# Patient Record
Sex: Female | Born: 1960 | Race: Black or African American | Hispanic: No | Marital: Single | Smoking: Former smoker
Health system: Southern US, Community
[De-identification: ages and names within clinical notes are randomized; demographics above are authoritative.]

## PROBLEM LIST (undated history)

## (undated) DIAGNOSIS — F32A Depression, unspecified: Secondary | ICD-10-CM

## (undated) DIAGNOSIS — F419 Anxiety disorder, unspecified: Secondary | ICD-10-CM

## (undated) DIAGNOSIS — D219 Benign neoplasm of connective and other soft tissue, unspecified: Secondary | ICD-10-CM

## (undated) DIAGNOSIS — F329 Major depressive disorder, single episode, unspecified: Secondary | ICD-10-CM

## (undated) DIAGNOSIS — Z789 Other specified health status: Secondary | ICD-10-CM

## (undated) HISTORY — DX: Anxiety disorder, unspecified: F41.9

## (undated) HISTORY — DX: Depression, unspecified: F32.A

## (undated) HISTORY — PX: NO PAST SURGERIES: SHX2092

---
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
## 1999-03-22 ENCOUNTER — Other Ambulatory Visit: Admission: RE | Admit: 1999-03-22 | Discharge: 1999-03-22 | Payer: Self-pay | Admitting: Family Medicine

## 1999-03-23 ENCOUNTER — Encounter: Admission: RE | Admit: 1999-03-23 | Discharge: 1999-03-23 | Payer: Self-pay | Admitting: Family Medicine

## 2001-07-15 ENCOUNTER — Encounter (INDEPENDENT_AMBULATORY_CARE_PROVIDER_SITE_OTHER): Payer: Self-pay | Admitting: *Deleted

## 2001-07-15 LAB — CONVERTED CEMR LAB

## 2001-07-23 ENCOUNTER — Encounter: Admission: RE | Admit: 2001-07-23 | Discharge: 2001-07-23 | Payer: Self-pay | Admitting: Family Medicine

## 2002-06-23 ENCOUNTER — Encounter: Admission: RE | Admit: 2002-06-23 | Discharge: 2002-06-23 | Payer: Self-pay | Admitting: Family Medicine

## 2004-11-08 ENCOUNTER — Emergency Department (HOSPITAL_COMMUNITY): Admission: EM | Admit: 2004-11-08 | Discharge: 2004-11-08 | Payer: Self-pay | Admitting: Family Medicine

## 2006-06-13 DIAGNOSIS — E669 Obesity, unspecified: Secondary | ICD-10-CM | POA: Insufficient documentation

## 2006-06-14 ENCOUNTER — Encounter (INDEPENDENT_AMBULATORY_CARE_PROVIDER_SITE_OTHER): Payer: Self-pay | Admitting: *Deleted

## 2012-09-21 ENCOUNTER — Encounter (HOSPITAL_COMMUNITY): Payer: Self-pay

## 2012-09-21 ENCOUNTER — Emergency Department (HOSPITAL_COMMUNITY): Payer: Medicaid Other

## 2012-09-21 ENCOUNTER — Observation Stay (HOSPITAL_COMMUNITY)
Admission: EM | Admit: 2012-09-21 | Discharge: 2012-09-22 | Disposition: A | Discharge status: Home / Self Care | Payer: Medicaid Other | Attending: Obstetrics & Gynecology | Admitting: Obstetrics & Gynecology

## 2012-09-21 DIAGNOSIS — D500 Iron deficiency anemia secondary to blood loss (chronic): Secondary | ICD-10-CM | POA: Insufficient documentation

## 2012-09-21 DIAGNOSIS — R0602 Shortness of breath: Secondary | ICD-10-CM | POA: Insufficient documentation

## 2012-09-21 DIAGNOSIS — N920 Excessive and frequent menstruation with regular cycle: Principal | ICD-10-CM | POA: Insufficient documentation

## 2012-09-21 DIAGNOSIS — R059 Cough, unspecified: Secondary | ICD-10-CM | POA: Insufficient documentation

## 2012-09-21 DIAGNOSIS — D649 Anemia, unspecified: Secondary | ICD-10-CM

## 2012-09-21 DIAGNOSIS — R079 Chest pain, unspecified: Secondary | ICD-10-CM | POA: Insufficient documentation

## 2012-09-21 DIAGNOSIS — R05 Cough: Secondary | ICD-10-CM | POA: Insufficient documentation

## 2012-09-21 DIAGNOSIS — N939 Abnormal uterine and vaginal bleeding, unspecified: Secondary | ICD-10-CM

## 2012-09-21 DIAGNOSIS — D259 Leiomyoma of uterus, unspecified: Secondary | ICD-10-CM | POA: Insufficient documentation

## 2012-09-21 LAB — CBC
MCV: 57.6 fL — ABNORMAL LOW (ref 78.0–100.0)
Platelets: 341 10*3/uL (ref 150–400)
RBC: 1.77 MIL/uL — ABNORMAL LOW (ref 3.87–5.11)
WBC: 6.1 10*3/uL (ref 4.0–10.5)

## 2012-09-21 LAB — COMPREHENSIVE METABOLIC PANEL
ALT: 8 U/L (ref 0–35)
AST: 13 U/L (ref 0–37)
Alkaline Phosphatase: 36 U/L — ABNORMAL LOW (ref 39–117)
CO2: 24 mEq/L (ref 19–32)
Chloride: 105 mEq/L (ref 96–112)
Creatinine, Ser: 0.92 mg/dL (ref 0.50–1.10)
GFR calc non Af Amer: 71 mL/min — ABNORMAL LOW (ref >90)
Total Bilirubin: 0.4 mg/dL (ref 0.3–1.2)

## 2012-09-21 LAB — POCT I-STAT, CHEM 8
Calcium, Ion: 1.14 mmol/L (ref 1.12–1.23)
Creatinine, Ser: 0.9 mg/dL (ref 0.50–1.10)
Hemoglobin: 5.4 g/dL — CL (ref 12.0–15.0)
Sodium: 141 mEq/L (ref 135–145)
TCO2: 22 mmol/L (ref 0–100)

## 2012-09-21 LAB — PREPARE RBC (CROSSMATCH)

## 2012-09-21 LAB — TROPONIN I: Troponin I: <0.3 ng/mL (ref <0.30)

## 2012-09-21 MED ORDER — ALBUTEROL SULFATE (5 MG/ML) 0.5% IN NEBU
5.0000 mg | INHALATION_SOLUTION | Freq: Once | RESPIRATORY_TRACT | Status: AC
  Administered 2012-09-21: 5 mg via RESPIRATORY_TRACT
  Filled 2012-09-21: qty 1

## 2012-09-21 MED ORDER — IPRATROPIUM BROMIDE 0.02 % IN SOLN
0.5000 mg | Freq: Once | RESPIRATORY_TRACT | Status: AC
  Administered 2012-09-21: 0.5 mg via RESPIRATORY_TRACT
  Filled 2012-09-21: qty 2.5

## 2012-09-21 NOTE — ED Provider Notes (Signed)
History     CSN: [id number]  Arrival date & time 09/21/12  1542   First MD Initiated Contact with Patient 09/21/12 1604      Chief Complaint  Patient presents with  . Shortness of Breath  . Chest Pain  . Cough     HPI Pt presents with NAD- Pt reports shortness of breath and chest pain left side that radiates to left chest and left arm and back. Increases with exertion and with lying down. Pt c/o of cough without fever or cold like symptoms.  Patient has had productive cough with clear sputum.  Patient denies wheezing.  Denies history of embolic disease or leg swelling or pain.  History reviewed. No pertinent past medical history.  History reviewed. No pertinent past surgical history.  No family history on file.  History  Substance Use Topics  . Smoking status: Never Smoker   . Smokeless tobacco: Not on file  . Alcohol Use: No    OB History   Grav Para Term Preterm Abortions TAB SAB Ect Mult Living                  Review of Systems  Genitourinary: Positive for vaginal bleeding (Very heavy periods for the last 2 years.  Goes through 1 package of pads every one to 2 days with periods lasting 7-10 days per minute).  All other systems reviewed and are negative.   All other systems reviewed and are negative Allergies  Review of patient's allergies indicates no known allergies.  Home Medications   Current Outpatient Rx  Name  Route  Sig  Dispense  Refill  . acetaminophen (TYLENOL) 500 MG tablet   Oral   Take 1,000 mg by mouth every 6 (six) hours as needed for pain.         . Aspirin-Salicylamide-Caffeine (BC HEADACHE POWDER PO)   Oral   Take 1 packet by mouth daily as needed (for pain).         Marland Kitchen ibuprofen (ADVIL,MOTRIN) 200 MG tablet   Oral   Take 600 mg by mouth every 8 (eight) hours as needed for pain.           BP 105/73  Pulse 103  Temp(Src) 98.8 F (37.1 C) (Oral)  Resp 18  SpO2 100%  LMP 09/19/2012  Physical Exam  Nursing note and vitals  reviewed. Constitutional: She is oriented to person, place, and time. She appears well-developed and well-nourished. No distress.  HENT:  Head: Normocephalic and atraumatic.  Mouth/Throat: Mucous membranes are pale.  Eyes: Pupils are equal, round, and reactive to light.  Conjunctiva pale  Neck: Normal range of motion.  Cardiovascular: Intact distal pulses.  Tachycardia present.   Pulmonary/Chest: No respiratory distress. She has no wheezes.  Abdominal: Normal appearance. She exhibits no distension.  Musculoskeletal: Normal range of motion.  Neurological: She is alert and oriented to person, place, and time. No cranial nerve deficit.  Skin: Skin is warm and dry. No rash noted.  Psychiatric: She has a normal mood and affect. Her behavior is normal.    ED Course  Procedures (including critical care time)  Date: 09/21/2012  Rate: 96  Rhythm: normal sinus rhythm  QRS Axis: normal  Intervals: normal  ST/T Wave abnormalities: normal  Conduction Disutrbances: none  Narrative Interpretation: unremarkable     Labs Reviewed  CBC - Abnormal; Notable for the following:    RBC 1.77 (*)    Hemoglobin 2.7 (*)    HCT 10.2 (*)  MCV 57.6 (*)    MCH 15.3 (*)    MCHC 26.5 (*)    RDW 23.9 (*)    All other components within normal limits  COMPREHENSIVE METABOLIC PANEL - Abnormal; Notable for the following:    Potassium 3.4 (*)    Glucose, Bld 111 (*)    Albumin 3.1 (*)    Alkaline Phosphatase 36 (*)    GFR calc non Af Amer 71 (*)    GFR calc Af Amer 83 (*)    All other components within normal limits  PRO B NATRIURETIC PEPTIDE - Abnormal; Notable for the following:    Pro B Natriuretic peptide (BNP) 308.2 (*)    All other components within normal limits  TROPONIN I  TYPE AND SCREEN  ABO/RH  PREPARE RBC (CROSSMATCH)   Dg Chest Portable 1 View  09/21/2012   *RADIOLOGY REPORT*  Clinical Data: Shortness of breath.  Chest pain.  Cough.  PORTABLE CHEST - 1 VIEW  Comparison: None.   Findings: Cardiomediastinal silhouette is within normal limits. The lungs are free of focal consolidations and pleural effusions. Bony structures have a normal appearance.  IMPRESSION: Negative exam.   Original Report Authenticated By: Norva Pavlov, M.D.     1. Anemia   2. Abnormal vaginal bleeding       MDM  After review of patient's labs and further review of her review of systems most likely source of her anemia is vaginal in origin.  Discussed case with GYN on call who okayed the transfer to [facility] hospital for definitive care and treatment.        Nelia Shi, MD 09/21/12 [phone]

## 2012-09-21 NOTE — ED Notes (Signed)
Pt presents with NAD- Pt reports shortness of breath and chest pain left side that radiates to left chest and left arm and back. Increases with exertion and with lying down.  Pt c/o of cough without fever or cold like symptoms.

## 2012-09-22 ENCOUNTER — Ambulatory Visit (HOSPITAL_COMMUNITY): Payer: Medicaid Other

## 2012-09-22 ENCOUNTER — Observation Stay (HOSPITAL_COMMUNITY): Payer: Self-pay

## 2012-09-22 DIAGNOSIS — D259 Leiomyoma of uterus, unspecified: Secondary | ICD-10-CM

## 2012-09-22 DIAGNOSIS — R0602 Shortness of breath: Secondary | ICD-10-CM

## 2012-09-22 DIAGNOSIS — N920 Excessive and frequent menstruation with regular cycle: Secondary | ICD-10-CM

## 2012-09-22 DIAGNOSIS — D500 Iron deficiency anemia secondary to blood loss (chronic): Secondary | ICD-10-CM

## 2012-09-22 LAB — CBC WITH DIFFERENTIAL/PLATELET
Basophils Absolute: 0.1 10*3/uL (ref 0.0–0.1)
Eosinophils Relative: 3 % (ref 0–5)
HCT: 20 % — ABNORMAL LOW (ref 36.0–46.0)
Hemoglobin: 6.5 g/dL — CL (ref 12.0–15.0)
Lymphocytes Relative: 28 % (ref 12–46)
Lymphs Abs: 1.4 10*3/uL (ref 0.7–4.0)
MCV: 69.9 fL — ABNORMAL LOW (ref 78.0–100.0)
Monocytes Absolute: 0.5 10*3/uL (ref 0.1–1.0)
Monocytes Relative: 10 % (ref 3–12)
Neutro Abs: 2.8 10*3/uL (ref 1.7–7.7)
RDW: 25.2 % — ABNORMAL HIGH (ref 11.5–15.5)
WBC: 4.9 10*3/uL (ref 4.0–10.5)

## 2012-09-22 LAB — CBC
MCH: 24.3 pg — ABNORMAL LOW (ref 26.0–34.0)
MCHC: 33.5 g/dL (ref 30.0–36.0)
MCV: 72.7 fL — ABNORMAL LOW (ref 78.0–100.0)
Platelets: 194 10*3/uL (ref 150–400)
RBC: 3.74 MIL/uL — ABNORMAL LOW (ref 3.87–5.11)

## 2012-09-22 LAB — ABO/RH: ABO/RH(D): AB POS

## 2012-09-22 MED ORDER — DEXTROSE-NACL 5-0.45 % IV SOLN: INTRAVENOUS | Status: DC

## 2012-09-22 MED ORDER — MEGESTROL ACETATE 40 MG PO TABS
40.0000 mg | ORAL_TABLET | Freq: Every day | ORAL | Status: DC
  Administered 2012-09-22: 40 mg via ORAL
  Filled 2012-09-22 (×2): qty 1

## 2012-09-22 MED ORDER — ALBUTEROL SULFATE (5 MG/ML) 0.5% IN NEBU
5.0000 mg | INHALATION_SOLUTION | RESPIRATORY_TRACT | Status: DC | PRN
  Administered 2012-09-22: 5 mg via RESPIRATORY_TRACT
  Filled 2012-09-22 (×3): qty 1

## 2012-09-22 MED ORDER — IPRATROPIUM BROMIDE 0.02 % IN SOLN
0.5000 mg | RESPIRATORY_TRACT | Status: DC | PRN
  Administered 2012-09-22: 0.5 mg via RESPIRATORY_TRACT
  Filled 2012-09-22: qty 2.5

## 2012-09-22 MED ORDER — ZOLPIDEM TARTRATE 5 MG PO TABS: 5.0000 mg | ORAL_TABLET | Freq: Every evening | ORAL | Status: DC | PRN

## 2012-09-22 MED ORDER — ALUM & MAG HYDROXIDE-SIMETH 200-200-20 MG/5ML PO SUSP: 30.0000 mL | ORAL | Status: DC | PRN

## 2012-09-22 MED ORDER — SIMETHICONE 80 MG PO CHEW: 80.0000 mg | CHEWABLE_TABLET | Freq: Four times a day (QID) | ORAL | Status: DC | PRN

## 2012-09-22 MED ORDER — DEXTROMETHORPHAN POLISTIREX 30 MG/5ML PO LQCR
30.0000 mg | Freq: Four times a day (QID) | ORAL | Status: DC | PRN
  Administered 2012-09-22 (×2): 30 mg via ORAL
  Filled 2012-09-22 (×2): qty 5

## 2012-09-22 MED ORDER — MEGESTROL ACETATE 20 MG PO TABS: 40.0000 mg | ORAL_TABLET | Freq: Every day | ORAL | Status: DC

## 2012-09-22 NOTE — Discharge Summary (Signed)
Physician Discharge Summary  Patient ID: KERIN KREN MRN: [id number] DOB/AGE: Jan 27, 1961 52 y.o.  Admit date: 09/21/2012 Discharge date: 09/22/2012  Admission Diagnoses: menorrhagia, fibroids, anemia  Discharge Diagnoses: same Active Problems:   * No active hospital problems. *   Discharged Condition: good  Hospital Course: She was admitted with a hemoglobin of 2.7. She was given a total of 6 units PRBC and 1 unit of FFP. Her bleeding slowed down significantly with 40 mg of Megace. Her u/s showed fibroids. She voiced her readiness to go home by the evening of 09/22/12. She was ambulating, eating, and voiding without difficulty.  Consults: None  Significant Diagnostic Studies: labs: as above  Treatments: transfusion as above  Discharge Exam: Blood pressure 126/78, pulse 66, temperature 98.4 F (36.9 C), temperature source Oral, resp. rate 20, height 5\' 6"  (1.676 m), weight 102.059 kg (225 lb), last menstrual period 09/19/2012, SpO2 100.00%. General appearance: alert Resp: clear to auscultation bilaterally Cardio: regular rate and rhythm, S1, S2 normal, no murmur, click, rub or gallop GI: soft, non-tender; bowel sounds normal; no masses,  no organomegaly  Disposition: Final discharge disposition not confirmed     Medication List    TAKE these medications       acetaminophen 500 MG tablet  Commonly known as:  TYLENOL  Take 1,000 mg by mouth every 6 (six) hours as needed for pain.     BC HEADACHE POWDER PO  Take 1 packet by mouth daily as needed (for pain).     ibuprofen 200 MG tablet  Commonly known as:  ADVIL,MOTRIN  Take 600 mg by mouth every 8 (eight) hours as needed for pain.     megestrol 20 MG tablet  Commonly known as:  MEGACE  Take 2 tablets (40 mg total) by mouth daily.           Follow-up Information   Follow up with [facility]. Schedule an appointment as soon as possible for a visit in 1 week. (endometrial biopsy)    Contact  information:   [address] [place] [address] [phone]      Signed: Allie Bossier. 09/22/2012, 5:08 PM

## 2012-09-22 NOTE — Progress Notes (Signed)
Patient ambulating to bathroom with steady gait.  Vaginal bleeding small amount.  Patient states she feels better.  No c/o chest pain or shortness of breath.

## 2012-09-22 NOTE — H&P (Signed)
Tamara Short is an 52 y.o. female. Pt is a [id number] who presented to [facility] ED with c/o chest pain with coughing and SOB.  She was found to have a Hgb of 2.  She reports that she has been bleeding heavy since end of 2013.  She reports occ hot flushes and mood changes.  She received 2 units of blood at [facility] prior to transfer and says that she feels a little better.  Her bleeding has decreased. She does reports a couple of months with no cycle at all.  Pertinent Gynecological History: Menses: irregular occurring approximately every 14 days with spotting approximately 20 days per month Bleeding: intermenstrual bleeding Contraception: none DES exposure: unknown Blood transfusions: none Sexually transmitted diseases: no past history Previous GYN Procedures: none  Last pap: many years ago Date: unk ?2009 OB History: G4, P4   Menstrual History:  Patient's last menstrual period was 09/19/2012.    History reviewed. No pertinent past medical history.  History reviewed. No pertinent past surgical history.  No family history on file.  Social History:  reports that she has never smoked. She does not have any smokeless tobacco history on file. She reports that she does not drink alcohol or use illicit drugs.  Allergies: No Known Allergies  Prescriptions prior to admission  Medication Sig Dispense Refill  . acetaminophen (TYLENOL) 500 MG tablet Take 1,000 mg by mouth every 6 (six) hours as needed for pain.      . Aspirin-Salicylamide-Caffeine (BC HEADACHE POWDER PO) Take 1 packet by mouth daily as needed (for pain).      Marland Kitchen ibuprofen (ADVIL,MOTRIN) 200 MG tablet Take 600 mg by mouth every 8 (eight) hours as needed for pain.        ROS  Blood pressure 120/64, pulse 72, temperature 98 F (36.7 C), temperature source Oral, resp. rate 18, height 5\' 6"  (1.676 m), weight 225 lb (102.059 kg), last menstrual period 09/19/2012, SpO2 100.00%. Physical Exam Lungs: CTA CV: RRR Abd: obese; NT, ND no  incision noted  Results for orders placed during the hospital encounter of 09/21/12 (from the past 24 hour(s))  TYPE AND SCREEN     Status: None   Collection Time    09/21/12  5:39 PM      Result Value Range   ABO/RH(D) AB POS     Antibody Screen NEG     Sample Expiration 09/24/2012     Unit Number [id number]     Blood Component Type RED CELLS,LR     Unit division 00     Status of Unit ISSUED,FINAL     Transfusion Status OK TO TRANSFUSE     Crossmatch Result Compatible     Unit Number [id number]     Blood Component Type RED CELLS,LR     Unit division 00     Status of Unit ISSUED,FINAL     Transfusion Status OK TO TRANSFUSE     Crossmatch Result Compatible     Unit Number [id number]     Blood Component Type RED CELLS,LR     Unit division 00     Status of Unit ISSUED,FINAL     Transfusion Status OK TO TRANSFUSE     Crossmatch Result Compatible     Unit Number [id number]     Blood Component Type RED CELLS,LR     Unit division 00     Status of Unit ALLOCATED     Transfusion Status OK TO TRANSFUSE     Crossmatch Result Compatible  CBC     Status: Abnormal   Collection Time    09/21/12  5:40 PM      Result Value Range   WBC 6.1  4.0 - 10.5 K/uL   RBC 1.77 (*) 3.87 - 5.11 MIL/uL   Hemoglobin 2.7 (*) 12.0 - 15.0 g/dL   HCT 16.1 (*) 09.6 - 04.5 %   MCV 57.6 (*) 78.0 - 100.0 fL   MCH 15.3 (*) 26.0 - 34.0 pg   MCHC 26.5 (*) 30.0 - 36.0 g/dL   RDW 40.9 (*) 81.1 - 91.4 %   Platelets 341  150 - 400 K/uL  COMPREHENSIVE METABOLIC PANEL     Status: Abnormal   Collection Time    09/21/12  5:40 PM      Result Value Range   Sodium 138  135 - 145 mEq/L   Potassium 3.4 (*) 3.5 - 5.1 mEq/L   Chloride 105  96 - 112 mEq/L   CO2 24  19 - 32 mEq/L   Glucose, Bld 111 (*) 70 - 99 mg/dL   BUN 8  6 - 23 mg/dL   Creatinine, Ser 7.82  0.50 - 1.10 mg/dL   Calcium 8.6  8.4 - 95.6 mg/dL   Total Protein 6.7  6.0 - 8.3 g/dL   Albumin 3.1 (*) 3.5 - 5.2 g/dL   AST 13  0 - 37 U/L    ALT 8  0 - 35 U/L   Alkaline Phosphatase 36 (*) 39 - 117 U/L   Total Bilirubin 0.4  0.3 - 1.2 mg/dL   GFR calc non Af Amer 71 (*) >90 mL/min   GFR calc Af Amer 83 (*) >90 mL/min  PRO B NATRIURETIC PEPTIDE     Status: Abnormal   Collection Time    09/21/12  5:40 PM      Result Value Range   Pro B Natriuretic peptide (BNP) 308.2 (*) 0 - 125 pg/mL  TROPONIN I     Status: None   Collection Time    09/21/12  5:40 PM      Result Value Range   Troponin I <0.30  <0.30 ng/mL  ABO/RH     Status: None   Collection Time    09/21/12  5:52 PM      Result Value Range   ABO/RH(D) AB POS    PREPARE RBC (CROSSMATCH)     Status: None   Collection Time    09/21/12  6:30 PM      Result Value Range   Order Confirmation ORDER PROCESSED BY BLOOD BANK    POCT I-STAT, CHEM 8     Status: Abnormal   Collection Time    09/21/12 10:26 PM      Result Value Range   Sodium 141  135 - 145 mEq/L   Potassium 3.8  3.5 - 5.1 mEq/L   Chloride 108  96 - 112 mEq/L   BUN 8  6 - 23 mg/dL   Creatinine, Ser 2.13  0.50 - 1.10 mg/dL   Glucose, Bld 91  70 - 99 mg/dL   Calcium, Ion 0.86  5.78 - 1.23 mmol/L   TCO2 22  0 - 100 mmol/L   Hemoglobin 5.4 (*) 12.0 - 15.0 g/dL   HCT 46.9 (*) 62.9 - 52.8 %   Comment NOTIFIED PHYSICIAN    ABO/RH     Status: None   Collection Time    09/22/12  1:28 AM      Result Value Range  ABO/RH(D) AB POS    PREPARE RBC (CROSSMATCH)     Status: None   Collection Time    09/22/12  1:37 AM      Result Value Range   Order Confirmation ORDER PROCESSED BY BLOOD BANK    TYPE AND SCREEN     Status: None   Collection Time    09/22/12  1:50 AM      Result Value Range   ABO/RH(D) AB POS     Antibody Screen NEG     Sample Expiration 09/25/2012     Unit Number [id number]     Blood Component Type RED CELLS,LR     Unit division 00     Status of Unit ALLOCATED     Transfusion Status OK TO TRANSFUSE     Crossmatch Result Compatible     Unit Number [id number]     Blood Component Type  RED CELLS,LR     Unit division 00     Status of Unit ISSUED     Transfusion Status OK TO TRANSFUSE     Crossmatch Result Compatible     Unit Number [id number]     Blood Component Type RED CELLS,LR     Unit division 00     Status of Unit ALLOCATED     Transfusion Status OK TO TRANSFUSE     Crossmatch Result Compatible    CBC WITH DIFFERENTIAL     Status: Abnormal   Collection Time    09/22/12  7:40 AM      Result Value Range   WBC 4.9  4.0 - 10.5 K/uL   RBC 2.86 (*) 3.87 - 5.11 MIL/uL   Hemoglobin 6.5 (*) 12.0 - 15.0 g/dL   HCT 03.4 (*) 74.2 - 59.5 %   MCV 69.9 (*) 78.0 - 100.0 fL   MCH 22.7 (*) 26.0 - 34.0 pg   MCHC 32.5  30.0 - 36.0 g/dL   RDW 63.8 (*) 75.6 - 43.3 %   Platelets 197  150 - 400 K/uL   Neutrophils Relative % 57  43 - 77 %   Neutro Abs 2.8  1.7 - 7.7 K/uL   Lymphocytes Relative 28  12 - 46 %   Lymphs Abs 1.4  0.7 - 4.0 K/uL   Monocytes Relative 10  3 - 12 %   Monocytes Absolute 0.5  0.1 - 1.0 K/uL   Eosinophils Relative 3  0 - 5 %   Eosinophils Absolute 0.1  0.0 - 0.7 K/uL   Basophils Relative 2 (*) 0 - 1 %   Basophils Absolute 0.1  0.0 - 0.1 K/uL    Dg Chest Portable 1 View  09/21/2012   *RADIOLOGY REPORT*  Clinical Data: Shortness of breath.  Chest pain.  Cough.  PORTABLE CHEST - 1 VIEW  Comparison: None.  Findings: Cardiomediastinal silhouette is within normal limits. The lungs are free of focal consolidations and pleural effusions. Bony structures have a normal appearance.  IMPRESSION: Negative exam.   Original Report Authenticated By: Norva Pavlov, M.D.    Assessment/Plan: Menometrorrhagia suspect due to perimenopause.  Need to r/o other causes of bleeding once pt stable.  Currently s/p 4 units of PRBC's.   Transfuse 1 unit FFP now CBC now Pelvic sono this am Megace 40mg  q day If Hct <25% will transfuse next unit of PRBC's Endometrial bx as outpatient Discharge later today after transfusions complete    HARRAWAY-SMITH, Auston Halfmann 09/22/2012,  8:07 AM

## 2012-09-22 NOTE — Progress Notes (Signed)
Ur chart review completed.  

## 2012-09-23 LAB — PREPARE FRESH FROZEN PLASMA

## 2012-09-23 LAB — TYPE AND SCREEN
Antibody Screen: NEGATIVE
Unit division: 0

## 2012-09-24 LAB — TYPE AND SCREEN
Unit division: 0
Unit division: 0

## 2012-09-29 ENCOUNTER — Other Ambulatory Visit: Payer: Self-pay | Admitting: Obstetrics & Gynecology

## 2012-10-31 ENCOUNTER — Ambulatory Visit (INDEPENDENT_AMBULATORY_CARE_PROVIDER_SITE_OTHER): Payer: Self-pay | Admitting: Obstetrics & Gynecology

## 2012-10-31 ENCOUNTER — Other Ambulatory Visit (HOSPITAL_COMMUNITY)
Admission: RE | Admit: 2012-10-31 | Discharge: 2012-10-31 | Disposition: A | Discharge status: Home / Self Care | Payer: Medicaid Other | Source: Ambulatory Visit | Attending: Obstetrics & Gynecology | Admitting: Obstetrics & Gynecology

## 2012-10-31 ENCOUNTER — Encounter: Payer: Self-pay | Admitting: Obstetrics & Gynecology

## 2012-10-31 VITALS — BP 124/86 | HR 69 | Temp 98.1°F | Ht 64.0 in | Wt 231.8 lb

## 2012-10-31 DIAGNOSIS — N949 Unspecified condition associated with female genital organs and menstrual cycle: Secondary | ICD-10-CM

## 2012-10-31 DIAGNOSIS — N920 Excessive and frequent menstruation with regular cycle: Secondary | ICD-10-CM | POA: Insufficient documentation

## 2012-10-31 DIAGNOSIS — N938 Other specified abnormal uterine and vaginal bleeding: Secondary | ICD-10-CM

## 2012-10-31 DIAGNOSIS — Z01812 Encounter for preprocedural laboratory examination: Secondary | ICD-10-CM

## 2012-10-31 DIAGNOSIS — D649 Anemia, unspecified: Secondary | ICD-10-CM

## 2012-10-31 DIAGNOSIS — Z0000 Encounter for general adult medical examination without abnormal findings: Secondary | ICD-10-CM

## 2012-10-31 DIAGNOSIS — Z01419 Encounter for gynecological examination (general) (routine) without abnormal findings: Secondary | ICD-10-CM

## 2012-10-31 LAB — POCT PREGNANCY, URINE: Preg Test, Ur: NEGATIVE

## 2012-10-31 LAB — TSH: TSH: 0.983 u[IU]/mL (ref 0.350–4.500)

## 2012-10-31 NOTE — Progress Notes (Signed)
  Subjective:    Patient ID: Tamara Short, female    DOB: 1960-05-06, 52 y.o.   MRN: [id number]  HPI  52 yo lady who has a h/o menorrhagia, DUB, anemia requiring transfusion of 6 units of PRBCs 6 weeks ago, and fibroids seen on u/s. She is here for [facility] and pap smear. She reports that her bleeding is slow with the daily megace.  Review of Systems Pap and mammogram are due.    Objective:   Physical Exam   UPT negative, consent signed, time out done Cervix prepped with betadine and grasped with a single tooth tenaculum Uterus sounded to 9 cm Pipelle used for 3 passes with a moderate amount of tissue obtained. She tolerated the procedure well.       Assessment & Plan:  Menorrhagia- probably due to her fibroids, but I will await the [facility] results to rule out endometrial pathology Preventative care- pap done today and mammogram ordered. She has no insurance so she will fill out paperwork for the mammogram scholarship program. Check CBC and TSH.

## 2012-10-31 NOTE — Addendum Note (Signed)
Addended by: Toula Moos on: 10/31/2012 12:10 PM   Modules accepted: Orders

## 2012-11-01 LAB — CBC
Hemoglobin: 12 g/dL (ref 12.0–15.0)
MCH: 25.9 pg — ABNORMAL LOW (ref 26.0–34.0)
MCHC: 33 g/dL (ref 30.0–36.0)
RDW: 22.3 % — ABNORMAL HIGH (ref 11.5–15.5)

## 2012-11-20 ENCOUNTER — Ambulatory Visit (HOSPITAL_COMMUNITY): Payer: Self-pay

## 2012-12-03 ENCOUNTER — Ambulatory Visit (HOSPITAL_COMMUNITY): Payer: MEDICAID

## 2012-12-04 ENCOUNTER — Ambulatory Visit: Payer: Self-pay | Admitting: Obstetrics & Gynecology

## 2012-12-12 ENCOUNTER — Ambulatory Visit (HOSPITAL_COMMUNITY)
Admission: RE | Admit: 2012-12-12 | Discharge: 2012-12-12 | Disposition: A | Discharge status: Home / Self Care | Payer: Self-pay | Source: Ambulatory Visit | Attending: Obstetrics & Gynecology | Admitting: Obstetrics & Gynecology

## 2012-12-12 DIAGNOSIS — Z0000 Encounter for general adult medical examination without abnormal findings: Secondary | ICD-10-CM

## 2012-12-22 ENCOUNTER — Ambulatory Visit (INDEPENDENT_AMBULATORY_CARE_PROVIDER_SITE_OTHER): Payer: Medicaid Other | Admitting: Obstetrics & Gynecology

## 2012-12-22 ENCOUNTER — Encounter: Payer: Self-pay | Admitting: Obstetrics & Gynecology

## 2012-12-22 VITALS — BP 129/85 | HR 66 | Temp 97.3°F | Ht 65.0 in | Wt 247.5 lb

## 2012-12-22 DIAGNOSIS — D259 Leiomyoma of uterus, unspecified: Secondary | ICD-10-CM

## 2012-12-22 DIAGNOSIS — N920 Excessive and frequent menstruation with regular cycle: Secondary | ICD-10-CM

## 2012-12-22 DIAGNOSIS — D219 Benign neoplasm of connective and other soft tissue, unspecified: Secondary | ICD-10-CM

## 2012-12-22 NOTE — Progress Notes (Signed)
  Subjective:    Patient ID: Tamara Short, female    DOB: [birth year], 52 y.o.   MRN: [id number]  HPI  52 yo abstinent lady with known fibroids (14 cm uterus on u/s in June 2014). She is here today to discuss her treatment options. She required a transfusion of 6 units of PRBC recently. She was treated with megace which did help her bleeding but caused her gain a lot of weight. She quit taking it. Since then she did have a period that lasted 2 weeks but was overall lighter. Her pap and EMBX were normal recently.  Review of Systems     Objective:   Physical Exam        Assessment & Plan:   We discussed treatment options including watchful waiting, depo provera, depo Lupron, and TAH. She is going to do research and call when she has decided which path she wants to take.

## 2013-03-16 ENCOUNTER — Encounter: Payer: Self-pay | Admitting: *Deleted

## 2013-05-11 ENCOUNTER — Encounter (HOSPITAL_COMMUNITY): Payer: Self-pay | Admitting: *Deleted

## 2013-05-11 ENCOUNTER — Telehealth: Payer: Self-pay | Admitting: *Deleted

## 2013-05-11 ENCOUNTER — Inpatient Hospital Stay (HOSPITAL_COMMUNITY)
Admission: AD | Admit: 2013-05-11 | Discharge: 2013-05-11 | Disposition: A | Discharge status: Home / Self Care | Payer: Medicaid Other | Source: Ambulatory Visit | Attending: Obstetrics & Gynecology | Admitting: Obstetrics & Gynecology

## 2013-05-11 DIAGNOSIS — N939 Abnormal uterine and vaginal bleeding, unspecified: Secondary | ICD-10-CM

## 2013-05-11 DIAGNOSIS — N926 Irregular menstruation, unspecified: Secondary | ICD-10-CM

## 2013-05-11 DIAGNOSIS — N949 Unspecified condition associated with female genital organs and menstrual cycle: Secondary | ICD-10-CM | POA: Insufficient documentation

## 2013-05-11 DIAGNOSIS — N938 Other specified abnormal uterine and vaginal bleeding: Secondary | ICD-10-CM | POA: Insufficient documentation

## 2013-05-11 DIAGNOSIS — N925 Other specified irregular menstruation: Secondary | ICD-10-CM | POA: Insufficient documentation

## 2013-05-11 HISTORY — DX: Benign neoplasm of connective and other soft tissue, unspecified: D21.9

## 2013-05-11 LAB — CBC
HCT: 31 % — ABNORMAL LOW (ref 36.0–46.0)
HEMOGLOBIN: 10 g/dL — AB (ref 12.0–15.0)
MCH: 24.2 pg — AB (ref 26.0–34.0)
MCHC: 32.3 g/dL (ref 30.0–36.0)
MCV: 74.9 fL — ABNORMAL LOW (ref 78.0–100.0)
Platelets: 194 10*3/uL (ref 150–400)
RBC: 4.14 MIL/uL (ref 3.87–5.11)
RDW: 18 % — ABNORMAL HIGH (ref 11.5–15.5)
WBC: 6.8 10*3/uL (ref 4.0–10.5)

## 2013-05-11 LAB — TYPE AND SCREEN
ABO/RH(D): AB POS
Antibody Screen: NEGATIVE

## 2013-05-11 MED ORDER — MEGESTROL ACETATE 40 MG PO TABS: ORAL_TABLET | ORAL | Status: DC

## 2013-05-11 NOTE — Discharge Instructions (Signed)

## 2013-05-11 NOTE — MAU Provider Note (Signed)
Chief Complaint: Vaginal Bleeding   First Provider Initiated Contact with Patient 05/11/13 2054     SUBJECTIVE HPI: Tamara Short is a 53 y.o. [id number] who presents with moderate vaginal bleeding and Hx of severe menorrhagia necessitating blood transfusion. Known fibroids. Seen at [facility] Dept today for bleeding. Pelvic exam done. Called [facility]. Scheduled for Depo injection 05/20/2013. Instructed to go to MAU for emergencies. Pt states she is not bleeding heavily, but thinks she needs her hemoglobin check. Denies dizziness, syncope, tachycardia. Mild cramping. Per RN note from [facility] clinic, patient has had vaginal bleeding since 04/27/13. States that it started off light, but has been heavier since 04/30/13.   Has taking Megace before w/ good results, but significant weight gain.   Past Medical History  Diagnosis Date  . Fibroid    OB History  Gravida Para Term Preterm AB SAB TAB Ectopic Multiple Living  6 4 4  0 2 0 2 0 0 4    # Outcome Date GA Lbr Len/2nd Weight Sex Delivery Anes PTL Lv  6 TAB           5 TAB           4 TRM           3 TRM           2 TRM           1 TRM              Past Surgical History  Procedure Laterality Date  . No past surgeries     History   Social History  . Marital Status: Single    Spouse Name: N/A    Number of Children: N/A  . Years of Education: N/A   Occupational History  . Not on file.   Social History Main Topics  . Smoking status: Never Smoker   . Smokeless tobacco: Never Used  . Alcohol Use: No  . Drug Use: No  . Sexual Activity: Not Currently   Other Topics Concern  . Not on file   Social History Narrative  . No narrative on file   No current facility-administered medications on file prior to encounter.   No current outpatient prescriptions on file prior to encounter.   No Known Allergies  ROS: Pertinent items in HPI  OBJECTIVE Blood pressure 129/85, pulse 79, temperature 98.6 F (37 C), temperature source Oral, resp. rate  18, height 5' 5.5" (1.664 m), weight 250 lb (113.399 kg). Patient Vitals for the past 24 hrs:  BP Temp Temp src Pulse Resp Height Weight  05/11/13 2059 129/85 mmHg - - 79 18 - -  05/11/13 2056 122/65 mmHg - - 74 18 - -  05/11/13 2051 116/67 mmHg - - 66 18 - -  05/11/13 2023 127/90 mmHg 98.6 F (37 C) Oral 77 18 5' 5.5" (1.664 m) 250 lb (113.399 kg)    GENERAL: Well-developed, well-nourished female in no acute distress. Normal color for race.  HEENT: Normocephalic HEART: normal rate RESP: normal effort ABDOMEN: Soft, non-tender SPECULUM EXAM: Declined  LAB RESULTS Results for orders placed during the hospital encounter of 05/11/13 (from the past 24 hour(s))  CBC     Status: Abnormal   Collection Time    05/11/13  9:18 PM      Result Value Range   WBC 6.8  4.0 - 10.5 K/uL   RBC 4.14  3.87 - 5.11 MIL/uL   Hemoglobin 10.0 (*) 12.0 - 15.0 g/dL  HCT 31.0 (*) 36.0 - 46.0 %   MCV 74.9 (*) 78.0 - 100.0 fL   MCH 24.2 (*) 26.0 - 34.0 pg   MCHC 32.3  30.0 - 36.0 g/dL   RDW 18.0 (*) 11.5 - 15.5 %   Platelets 194  150 - 400 K/uL  TYPE AND SCREEN     Status: None   Collection Time    05/11/13  9:18 PM      Result Value Range   ABO/RH(D) AB POS     Antibody Screen PENDING     Sample Expiration 05/14/2013      IMAGING No results found.  MAU COURSE Care assumed from [place] [place] [facility] [place] 1. Abnormal uterine bleeding (AUB)     PLAN Discharge home Rx for Megace given to patient. Patient advised to take as directed and continue Rx until clinic follow-up Bleeding precautions discussed Patient advised to keep scheduled appointment for Depo Provera with [facility] clinic on 05/20/13 Patient may return to MAU as needed or if her condition were to change or worsen     Follow-up Information   Follow up with [facility]. (As scheduled on 05/20/13)    Specialty:  Obstetrics and Gynecology   Contact information:   [place]  [address] [phone]      Follow up with [facility]. (As needed if symptoms worsen)    Contact information:   [address] [id number] [place] [address] [phone]       Medication List         megestrol 40 MG tablet  Commonly known as:  MEGACE  - Take two tablets (40 mg) three times per day time three days,   - then take two tablets (40 mg) two times per day time three days,   - then take two tablets (40 mg)once per day         Farris Has, PA-C 05/11/2013  10:10 PM

## 2013-05-11 NOTE — Telephone Encounter (Signed)
Tamara Short called and left a message with her name and phone number, but did not specify what she needed assistance with.

## 2013-05-11 NOTE — MAU Note (Signed)
C/o irregular periods with missed periods and then heavy periods; diagnosed with uterine fibroids;

## 2013-05-11 NOTE — MAU Note (Signed)
Seen at the [facility] today and had a pelvic done; is scheduled for an injection on Feb 4th for her heavy periods; sent to MAU for a hemoglobin check; had 6 pint transfusion back in July; hx of fibroids;

## 2013-05-11 NOTE — Telephone Encounter (Signed)
Called Tamara Short back and she reports she was on the megace before and she wants to see if she can get a prescription for that.  She reports had a period for about a week and it was sort of normal, then it stopped for about a week, then restarted around 12th - was light at first , but then since about the 15th has been heavy. States if she sits mostly , she has to change her pad 2-3 times in an hour. Also c/o feeling lightheaded sometimes.  I advised to come to MAU today asap for evaluation as she may be anemic again and need blood transfusion like she did last time.  Advised her to have someone drive her if possible.  [name] states she will come into MAU for evaluation this evening.

## 2013-05-11 NOTE — MAU Note (Signed)
Hx of fibroids and hx of 6 pint transfusion back in July; menopausal with irregular period alternating with heavy periods;

## 2013-05-20 ENCOUNTER — Ambulatory Visit (INDEPENDENT_AMBULATORY_CARE_PROVIDER_SITE_OTHER): Payer: Medicaid Other | Admitting: *Deleted

## 2013-05-20 VITALS — BP 116/65 | HR 78 | Temp 97.8°F

## 2013-05-20 DIAGNOSIS — N949 Unspecified condition associated with female genital organs and menstrual cycle: Secondary | ICD-10-CM

## 2013-05-20 DIAGNOSIS — Z3009 Encounter for other general counseling and advice on contraception: Secondary | ICD-10-CM

## 2013-05-20 DIAGNOSIS — Z30013 Encounter for initial prescription of injectable contraceptive: Secondary | ICD-10-CM

## 2013-05-20 DIAGNOSIS — N938 Other specified abnormal uterine and vaginal bleeding: Secondary | ICD-10-CM

## 2013-05-20 LAB — POCT PREGNANCY, URINE: PREG TEST UR: NEGATIVE

## 2013-05-20 MED ORDER — MEDROXYPROGESTERONE ACETATE 104 MG/0.65ML ~~LOC~~ SUSP
104.0000 mg | Freq: Once | SUBCUTANEOUS | Status: AC
  Administered 2013-05-20: 104 mg via SUBCUTANEOUS

## 2013-05-20 NOTE — Progress Notes (Signed)
Preg test Negative: Depo Provera 104 mg given

## 2013-06-17 ENCOUNTER — Telehealth: Payer: Self-pay | Admitting: *Deleted

## 2013-06-17 NOTE — Telephone Encounter (Signed)
Pt elft message stating that she received Depo injection last month to control her bleeding. She has started bleeding again and would like to speak to a nurse.

## 2013-06-18 MED ORDER — MEGESTROL ACETATE 40 MG PO TABS: 40.0000 mg | ORAL_TABLET | Freq: Every day | ORAL | Status: DC

## 2013-06-18 NOTE — Telephone Encounter (Signed)
Called pt and pt stated that she received the depo shot and she started bleeding yesterday with clots bigger than a quarter size.  Pt chart shows a normal endo bx in July 2014 but she has not been seen by a provider since September I advised her to make an appt.  Pt asked if she could take the megace.  Per Dr. Elly Modena, pt can take Megace 40 mg po daily with one refill until she can evaluated by a provider at her appt.  Pt stated understanding.

## 2013-07-27 ENCOUNTER — Ambulatory Visit: Payer: Medicaid Other | Admitting: Obstetrics & Gynecology

## 2013-08-12 ENCOUNTER — Ambulatory Visit (INDEPENDENT_AMBULATORY_CARE_PROVIDER_SITE_OTHER): Payer: Medicaid Other | Admitting: *Deleted

## 2013-08-12 VITALS — BP 122/81 | HR 85 | Temp 98.8°F | Wt 261.7 lb

## 2013-08-12 DIAGNOSIS — N949 Unspecified condition associated with female genital organs and menstrual cycle: Secondary | ICD-10-CM

## 2013-08-12 DIAGNOSIS — N938 Other specified abnormal uterine and vaginal bleeding: Secondary | ICD-10-CM

## 2013-08-12 DIAGNOSIS — N925 Other specified irregular menstruation: Secondary | ICD-10-CM

## 2013-08-12 MED ORDER — MEDROXYPROGESTERONE ACETATE 150 MG/ML IM SUSP
150.0000 mg | Freq: Once | INTRAMUSCULAR | Status: AC
  Administered 2013-08-12: 150 mg via INTRAMUSCULAR

## 2013-08-12 NOTE — Progress Notes (Signed)
Pt continues to bleed heavily on the Depo 104 mgSQ  Will go to Depo Provera 150 mg

## 2013-08-18 ENCOUNTER — Other Ambulatory Visit: Payer: Self-pay | Admitting: Obstetrics and Gynecology

## 2013-08-18 ENCOUNTER — Telehealth: Payer: Self-pay

## 2013-08-18 DIAGNOSIS — N938 Other specified abnormal uterine and vaginal bleeding: Secondary | ICD-10-CM

## 2013-08-18 MED ORDER — MEGESTROL ACETATE 40 MG PO TABS
ORAL_TABLET | ORAL | Status: DC
Start: 2013-08-18 — End: 2013-09-30

## 2013-08-18 NOTE — Telephone Encounter (Signed)
Pt. Called stating she got a depo shot 4/29 and has been bleeding since. Would like a refill of megace to help with bleeding. Per chart review, pt. Has had two depo injections, the first one was 104mg  second one was 150mg  due to still bleeding heavily.

## 2013-08-18 NOTE — Telephone Encounter (Signed)
Spoke to Dr. Ihor Dow who reviewed pt.'s chart. Agreed to order Megace 40mg  with orders to take BID X2 weeks and then daily. 3 refills. Pt. Needs to be seen her in clinic before August. Message sent to admin pool to schedule pt. Appointment. Attempted to call pt. No answer. Left message stating we are returning your call, please call clinic. Pt. Needs to be informed Megace has been refilled but she will be called with the next available appointment and she needs to be seen so that provider can discuss symptoms and  Re-evaluate plan of care; pt. Must come to that appointment.

## 2013-08-19 NOTE — Telephone Encounter (Signed)
Pt. Returned call. Informed her megace has been refilled-- advised her to take Megace 40mg  BID for 2 weeks and then daily after two weeks. Pt. Has an appointment 09/30/13 here in clinic to re-evaluate plan of care. Pt. States she can and will make that appointment. No further questions or concerns.

## 2013-08-19 NOTE — Telephone Encounter (Signed)
Attempted to call pt. Man answered and stated she would be home in 10 minutes. Asked to have pt. Call clinic when she gets home-- man stated he would.

## 2013-09-20 IMAGING — MG MS DIGITAL SCREENING BILAT
5 series · 5 of 5 positions shown · non-contrast
Comparison: None.

CLINICAL DATA: Screening. Baseline.

DIGITAL SCREENING BILATERAL MAMMOGRAM WITH CAD

[R CC]
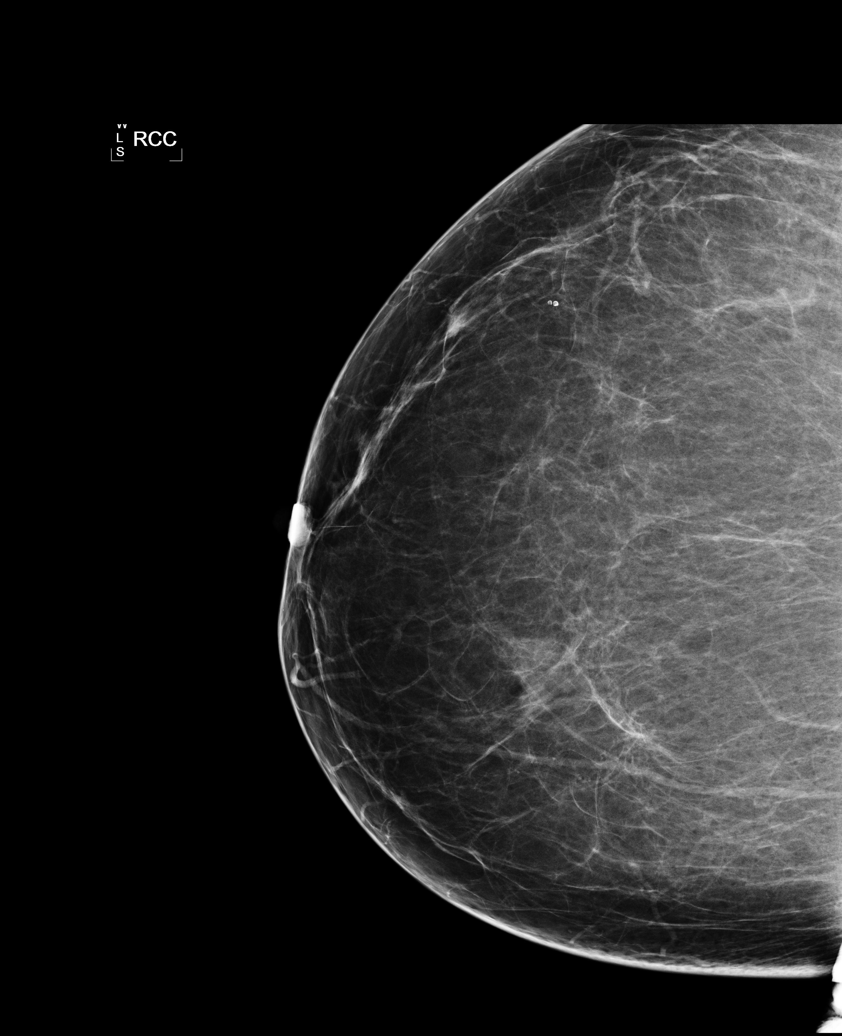

[R MLO]
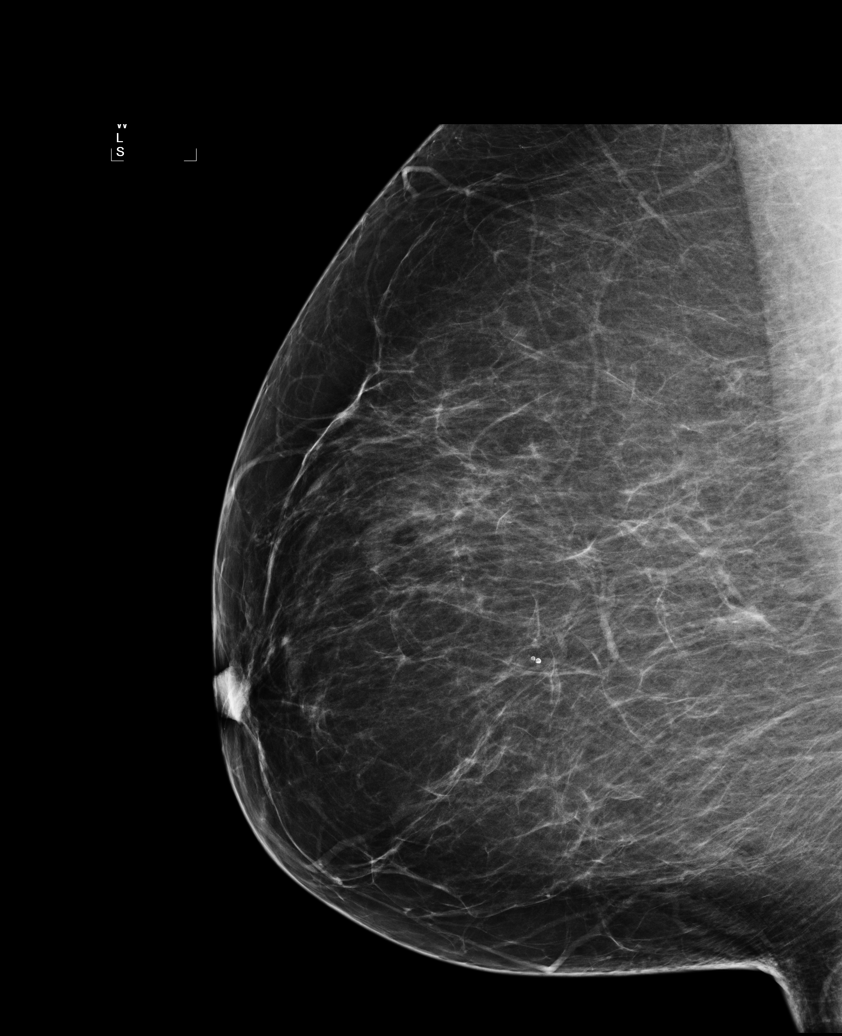

[L CC]
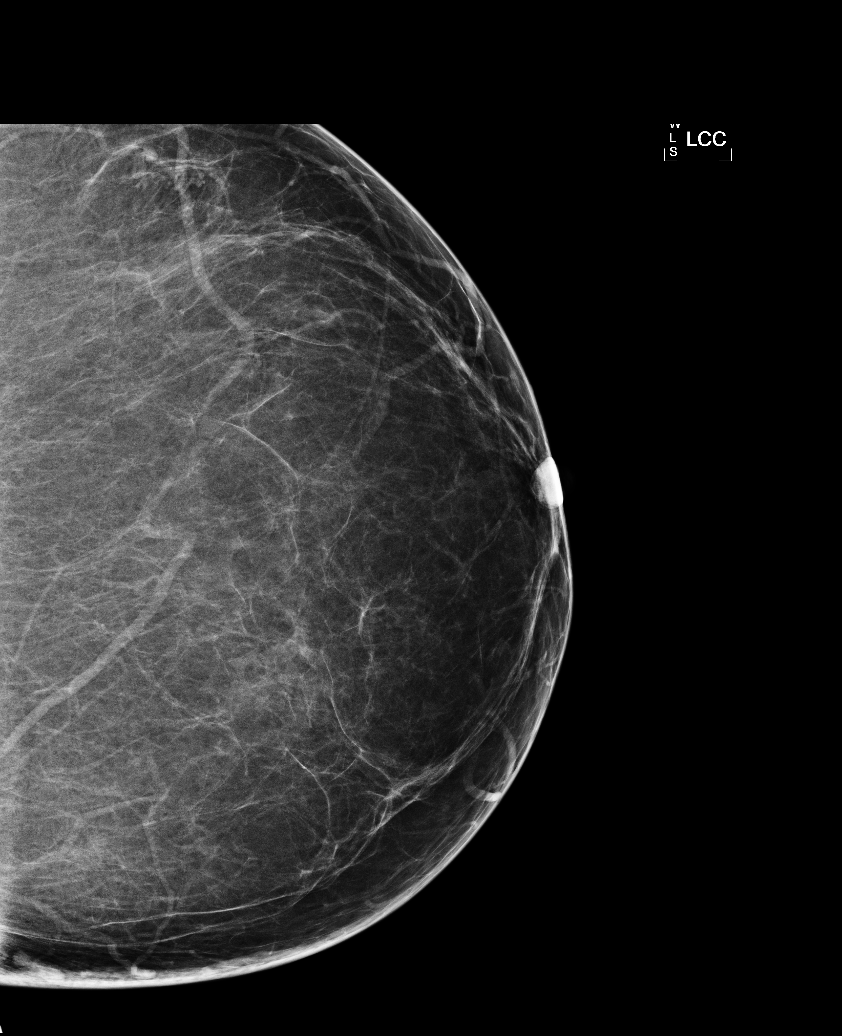

[L MLO (1 of 2)]
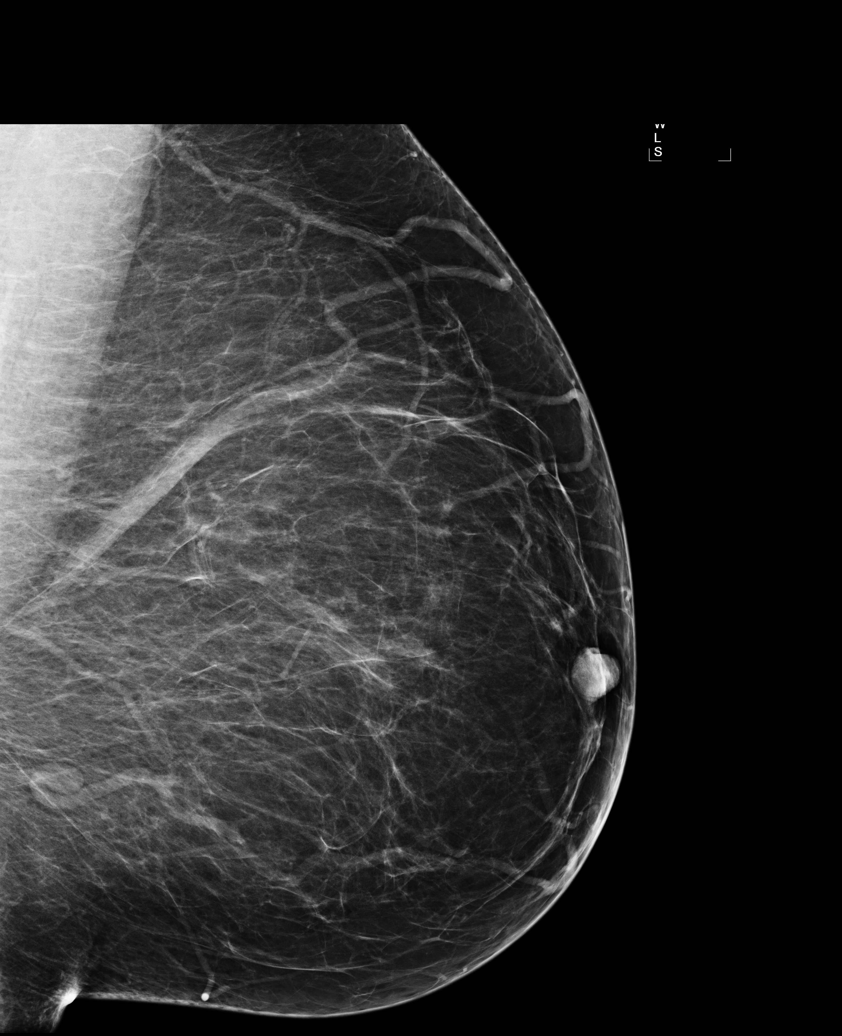

[L MLO (2 of 2)]
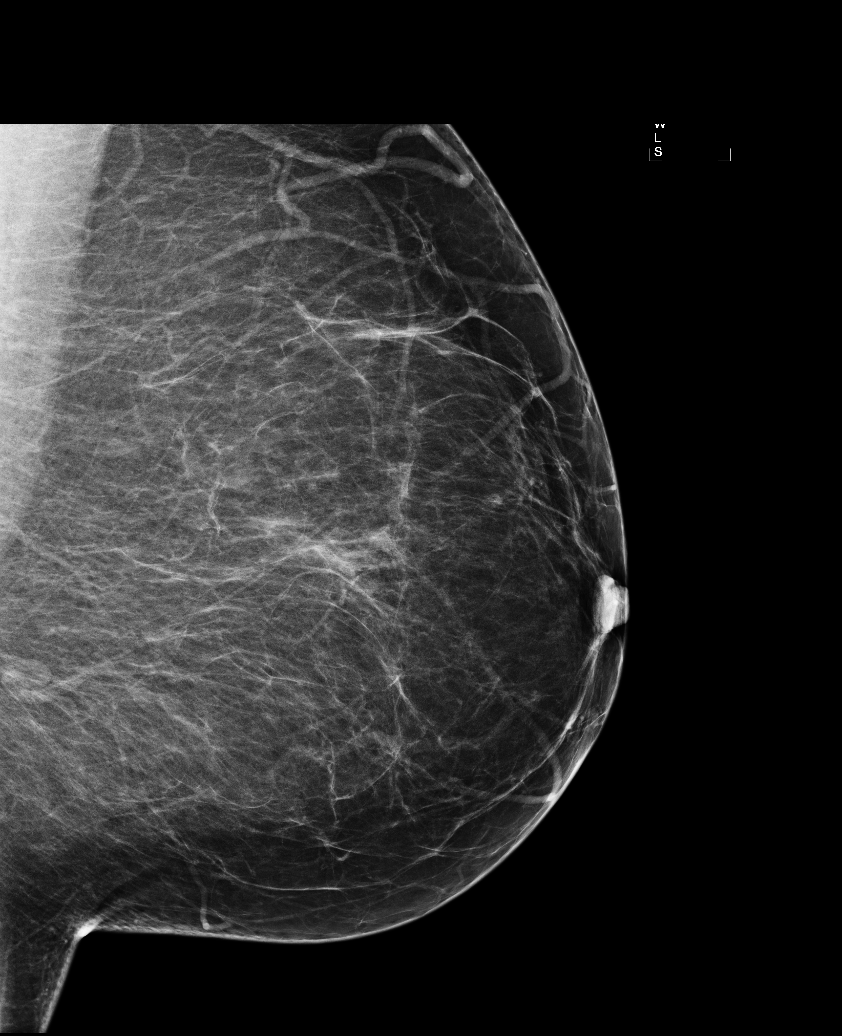

[5 of 5 positions shown; findings below may reference images not displayed]

FINDINGS: ACR Breast Density Category b:  There are scattered areas of
fibroglandular density.

There are no findings suspicious for malignancy.

Images were processed with CAD.
IMPRESSION: No mammographic evidence of malignancy.

A result letter of this screening mammogram will be mailed directly
to the patient.

RECOMMENDATION:
Screening mammogram in one year. (Code:WV-[id number])

BI-RADS CATEGORY 1:  Negative.

## 2013-09-30 ENCOUNTER — Encounter: Payer: Self-pay | Admitting: Obstetrics and Gynecology

## 2013-09-30 ENCOUNTER — Ambulatory Visit (INDEPENDENT_AMBULATORY_CARE_PROVIDER_SITE_OTHER): Payer: Medicaid Other | Admitting: Obstetrics and Gynecology

## 2013-09-30 VITALS — BP 126/86 | HR 87 | Temp 98.4°F | Ht 63.0 in | Wt 261.7 lb

## 2013-09-30 DIAGNOSIS — N939 Abnormal uterine and vaginal bleeding, unspecified: Secondary | ICD-10-CM | POA: Insufficient documentation

## 2013-09-30 DIAGNOSIS — N949 Unspecified condition associated with female genital organs and menstrual cycle: Secondary | ICD-10-CM

## 2013-09-30 DIAGNOSIS — N938 Other specified abnormal uterine and vaginal bleeding: Secondary | ICD-10-CM

## 2013-09-30 DIAGNOSIS — N926 Irregular menstruation, unspecified: Secondary | ICD-10-CM

## 2013-09-30 MED ORDER — LEUPROLIDE ACETATE (3 MONTH) 11.25 MG IM KIT: 11.2500 mg | PACK | INTRAMUSCULAR | Status: DC

## 2013-09-30 MED ORDER — LEUPROLIDE ACETATE (3 MONTH) 11.25 MG IM KIT
11.2500 mg | PACK | Freq: Once | INTRAMUSCULAR | Status: AC
  Administered 2013-09-30: 11.25 mg via INTRAMUSCULAR

## 2013-09-30 NOTE — Progress Notes (Signed)
Patient ID: Tamara Short, female   DOB: 24-Aug-1960, 53 y.o.   MRN: [id number] 53 yo [id number] with fibroid uterus and abnormal uterine bleeding here for further management. Patient reports persistent vaginal bleeding despite Megace and Depo-Provera. Although, her bleeding has improved she reports daily spotting which increases in flow following intercourse.   PE Declined  A/P 53 yo with fibroid uterus and abnormal uterine bleeding - Discussed medical management with depo-Lupron vs TAH. Patient desires to try Lupron before contemplating hysterectomy - Depo-lupron today -RTC in 6 months for further follow up

## 2013-10-28 ENCOUNTER — Ambulatory Visit (INDEPENDENT_AMBULATORY_CARE_PROVIDER_SITE_OTHER): Payer: Medicaid Other

## 2013-10-28 VITALS — BP 135/88 | HR 87 | Wt 266.5 lb

## 2013-10-28 DIAGNOSIS — N939 Abnormal uterine and vaginal bleeding, unspecified: Secondary | ICD-10-CM | POA: Diagnosis not present

## 2013-10-28 DIAGNOSIS — N926 Irregular menstruation, unspecified: Secondary | ICD-10-CM | POA: Diagnosis not present

## 2013-10-28 MED ORDER — MEDROXYPROGESTERONE ACETATE 150 MG/ML IM SUSP
150.0000 mg | Freq: Once | INTRAMUSCULAR | Status: AC
  Administered 2013-10-28: 150 mg via INTRAMUSCULAR

## 2013-11-16 ENCOUNTER — Telehealth: Payer: Self-pay | Admitting: *Deleted

## 2013-11-16 NOTE — Telephone Encounter (Signed)
Pt called nurse line requesting to initiate process for surgery.  Contacted patient and discussed her desire to proceed with surgical intervention for vaginal bleeding.  Informed patient that the front office would contact her with an appointment, but most appointments are not being scheduled until end of September, pt verbalizes understanding.

## 2013-12-30 ENCOUNTER — Ambulatory Visit (INDEPENDENT_AMBULATORY_CARE_PROVIDER_SITE_OTHER): Payer: Medicaid Other | Admitting: Obstetrics and Gynecology

## 2013-12-30 ENCOUNTER — Encounter: Payer: Self-pay | Admitting: Obstetrics and Gynecology

## 2013-12-30 ENCOUNTER — Other Ambulatory Visit (HOSPITAL_COMMUNITY)
Admission: RE | Admit: 2013-12-30 | Discharge: 2013-12-30 | Disposition: A | Discharge status: Home / Self Care | Payer: Medicaid Other | Source: Ambulatory Visit | Attending: Obstetrics and Gynecology | Admitting: Obstetrics and Gynecology

## 2013-12-30 VITALS — BP 147/78 | HR 82 | Resp 20 | Ht 64.0 in | Wt 258.9 lb

## 2013-12-30 DIAGNOSIS — N938 Other specified abnormal uterine and vaginal bleeding: Secondary | ICD-10-CM

## 2013-12-30 DIAGNOSIS — N949 Unspecified condition associated with female genital organs and menstrual cycle: Secondary | ICD-10-CM | POA: Insufficient documentation

## 2013-12-30 DIAGNOSIS — N925 Other specified irregular menstruation: Secondary | ICD-10-CM | POA: Diagnosis not present

## 2013-12-30 DIAGNOSIS — N920 Excessive and frequent menstruation with regular cycle: Secondary | ICD-10-CM

## 2013-12-30 NOTE — Progress Notes (Signed)
Patient ID: Tamara Short, female   DOB: [birth year], 53 y.o.   MRN: [id number] 53 yo [id number] presenting today for follow up on abnormal uterine bleeding. Patient received 2 doses of Depo-Lupron and reports persistent daily vaginal bleeding. She admits that it is not as heavy as before but it is daily. The depo-lupron seems to help a little but the bleeding becomes heavier as she is due for her next dose. Patient is ready for surgical intervention  Past Medical History  Diagnosis Date  . Fibroid    Past Surgical History  Procedure Laterality Date  . No past surgeries     Family History  Problem Relation Age of Onset  . Alcohol abuse Neg Hx   . Arthritis Neg Hx   . Asthma Neg Hx   . Birth defects Neg Hx   . Cancer Neg Hx   . COPD Neg Hx   . Depression Neg Hx   . Diabetes Neg Hx   . Drug abuse Neg Hx   . Early death Neg Hx   . Hearing loss Neg Hx   . Heart disease Neg Hx   . Hyperlipidemia Neg Hx   . Hypertension Neg Hx   . Kidney disease Neg Hx   . Learning disabilities Neg Hx   . Mental illness Neg Hx   . Mental retardation Neg Hx   . Miscarriages / Stillbirths Neg Hx   . Stroke Neg Hx   . Vision loss Neg Hx   . Varicose Veins Neg Hx    History  Substance Use Topics  . Smoking status: Never Smoker   . Smokeless tobacco: Never Used  . Alcohol Use: No   GENERAL: Well-developed, well-nourished female in no acute distress.  ABDOMEN: Soft, nontender, nondistended. No organomegaly. PELVIC: Normal external female genitalia. Vagina is pink and rugated.  Normal discharge. Normal appearing cervix. Uterus is normal in size.  No adnexal mass or tenderness. EXTREMITIES: No cyanosis, clubbing, or edema, 2+ distal pulses.  A/P 53 yo with DUB. Patient is interested in endometrial ablation ENDOMETRIAL BIOPSY     The indications for endometrial biopsy were reviewed.   Risks of the biopsy including cramping, bleeding, infection, uterine perforation, inadequate specimen and need for  additional procedures  were discussed. The patient states she understands and agrees to undergo procedure today. Consent was signed. Time out was performed. Urine HCG was negative. A sterile speculum was placed in the patient's vagina and the cervix was prepped with Betadine. A single-toothed tenaculum was placed on the anterior lip of the cervix to stabilize it. The uterine cavity was sounded to a depth of 10 cm using the uterine sound. The 3 mm pipelle was introduced into the endometrial cavity without difficulty, 2 passes were made.  A  moderate amount of tissue was  sent to pathology. The instruments were removed from the patient's vagina. Minimal bleeding from the cervix was noted. The patient tolerated the procedure well.  Routine post-procedure instructions were given to the patient. The patient will follow up in two weeks to review the results and for further management.   Risks, benefits and alternatives were explained including but not limited to risks of bleeding, infection, uterine perforation and damage to adjacent organs. Patient verbalized understanding and all questions. Patient will be scheduled for endometrial ablation

## 2014-01-14 ENCOUNTER — Ambulatory Visit (INDEPENDENT_AMBULATORY_CARE_PROVIDER_SITE_OTHER): Payer: Medicaid Other | Admitting: *Deleted

## 2014-01-14 VITALS — BP 118/63 | HR 91 | Temp 99.2°F

## 2014-01-14 DIAGNOSIS — N938 Other specified abnormal uterine and vaginal bleeding: Secondary | ICD-10-CM

## 2014-01-14 DIAGNOSIS — Z3042 Encounter for surveillance of injectable contraceptive: Secondary | ICD-10-CM

## 2014-01-14 MED ORDER — MEDROXYPROGESTERONE ACETATE 150 MG/ML IM SUSP
150.0000 mg | Freq: Once | INTRAMUSCULAR | Status: AC
  Administered 2014-01-14: 150 mg via INTRAMUSCULAR

## 2014-01-14 NOTE — Progress Notes (Signed)
Depo Provera 150mg 

## 2014-01-20 ENCOUNTER — Encounter (HOSPITAL_COMMUNITY): Payer: Self-pay | Admitting: Pharmacist

## 2014-01-21 ENCOUNTER — Encounter (HOSPITAL_COMMUNITY): Payer: Self-pay | Admitting: *Deleted

## 2014-02-01 NOTE — H&P (Signed)
Tamara Short is an 53 y.o. female [id number] with abnoraml uterine bleeding and failed medical management with depo-Lupron  Presenting today for scheduled endometrial ablation.  Pertinent Gynecological History: Menses: abnormal uterine bleeding Bleeding: dysfunctional uterine bleeding Contraception: none DES exposure: denies Sexually transmitted diseases: no past history Previous GYN Procedures: n/a  Last mammogram: normal Date: 12/2013 Last pap: normal Date: 10/2012 OB History: G6, P4024   Menstrual History: Patient's last menstrual period was 02/01/2014.    Past Medical History  Diagnosis Date  . Fibroid   . Medical history non-contributory     Past Surgical History  Procedure Laterality Date  . No past surgeries    . Cyst removed Left 1983    Family History  Problem Relation Age of Onset  . Alcohol abuse Neg Hx   . Arthritis Neg Hx   . Asthma Neg Hx   . Birth defects Neg Hx   . Cancer Neg Hx   . COPD Neg Hx   . Depression Neg Hx   . Diabetes Neg Hx   . Drug abuse Neg Hx   . Early death Neg Hx   . Hearing loss Neg Hx   . Heart disease Neg Hx   . Hyperlipidemia Neg Hx   . Hypertension Neg Hx   . Kidney disease Neg Hx   . Learning disabilities Neg Hx   . Mental illness Neg Hx   . Mental retardation Neg Hx   . Miscarriages / Stillbirths Neg Hx   . Stroke Neg Hx   . Vision loss Neg Hx   . Varicose Veins Neg Hx     Social History:  reports that she has never smoked. She has never used smokeless tobacco. She reports that she does not drink alcohol or use illicit drugs.  Allergies: No Known Allergies  Prescriptions prior to admission  Medication Sig Dispense Refill  . leuprolide (LUPRON) 11.25 MG injection Inject 11.25 mg into the muscle every 3 (three) months. Last dose 01/14/14      . megestrol (MEGACE) 40 MG tablet TAKE ONE TABLET BY MOUTH ONCE DAILY  30 tablet  0    Review of Systems  All other systems reviewed and are negative.   Blood pressure  119/83, pulse 80, temperature 98.8 F (37.1 C), temperature source Oral, resp. rate 20, height 5' 4.5" (1.638 m), weight 255 lb (115.667 kg), last menstrual period 02/01/2014, SpO2 99.00%. Physical Exam GENERAL: Well-developed, well-nourished female in no acute distress.  HEENT: Normocephalic, atraumatic. Sclerae anicteric.  NECK: Supple. Normal thyroid.  LUNGS: Clear to auscultation bilaterally.  HEART: Regular rate and rhythm. BREASTS: Symmetric in size. No palpable masses or lymphadenopathy, skin changes, or nipple drainage. ABDOMEN: Soft, nontender, nondistended. No organomegaly. PELVIC: Deferred to OR EXTREMITIES: No cyanosis, clubbing, or edema, 2+ distal pulses.  Results for orders placed during the hospital encounter of 02/02/14 (from the past 24 hour(s))  PREGNANCY, URINE     Status: None   Collection Time    02/02/14  7:50 AM      Result Value Ref Range   Preg Test, Ur NEGATIVE  NEGATIVE  CBC     Status: Abnormal   Collection Time    02/02/14  7:50 AM      Result Value Ref Range   WBC 6.2  4.0 - 10.5 K/uL   RBC 5.05  3.87 - 5.11 MIL/uL   Hemoglobin 9.0 (*) 12.0 - 15.0 g/dL   HCT 31.7 (*) 36.0 - 46.0 %   MCV  62.8 (*) 78.0 - 100.0 fL   MCH 17.8 (*) 26.0 - 34.0 pg   MCHC 28.4 (*) 30.0 - 36.0 g/dL   RDW 19.2 (*) 11.5 - 15.5 %   Platelets 283  150 - 400 K/uL    No results found. 12/2013 Endometrial biopsy: inactive endometrium. Negative for hyperplasia or malignancy  Assessment/Plan: 53 yo with DUB here for endometrial ablation - Risks, benefits, and alternatives were explained including but not limited to risks of bleeding, infection, uterine perforation or damage to adjacent organs. Patint verbalized understanding and all questions were answered.  Tamara Short 02/02/2014, 8:29 AM

## 2014-02-02 ENCOUNTER — Encounter (HOSPITAL_COMMUNITY)
Admission: RE | Disposition: A | Discharge status: Home / Self Care | Payer: Self-pay | Source: Ambulatory Visit | Attending: Obstetrics and Gynecology

## 2014-02-02 ENCOUNTER — Encounter (HOSPITAL_COMMUNITY): Payer: Medicaid Other | Admitting: Certified Registered Nurse Anesthetist

## 2014-02-02 ENCOUNTER — Ambulatory Visit (HOSPITAL_COMMUNITY): Payer: Medicaid Other | Admitting: Certified Registered Nurse Anesthetist

## 2014-02-02 ENCOUNTER — Ambulatory Visit (HOSPITAL_COMMUNITY)
Admission: RE | Admit: 2014-02-02 | Discharge: 2014-02-02 | Disposition: A | Discharge status: Home / Self Care | Payer: Medicaid Other | Source: Ambulatory Visit | Attending: Obstetrics and Gynecology | Admitting: Obstetrics and Gynecology

## 2014-02-02 ENCOUNTER — Encounter (HOSPITAL_COMMUNITY): Payer: Self-pay

## 2014-02-02 DIAGNOSIS — Z79899 Other long term (current) drug therapy: Secondary | ICD-10-CM | POA: Diagnosis not present

## 2014-02-02 DIAGNOSIS — N938 Other specified abnormal uterine and vaginal bleeding: Secondary | ICD-10-CM | POA: Diagnosis present

## 2014-02-02 DIAGNOSIS — D259 Leiomyoma of uterus, unspecified: Secondary | ICD-10-CM | POA: Diagnosis not present

## 2014-02-02 DIAGNOSIS — N939 Abnormal uterine and vaginal bleeding, unspecified: Secondary | ICD-10-CM

## 2014-02-02 HISTORY — DX: Other specified health status: Z78.9

## 2014-02-02 HISTORY — PX: HYSTEROSCOPY WITH NOVASURE: SHX5574

## 2014-02-02 LAB — CBC
HEMATOCRIT: 31.7 % — AB (ref 36.0–46.0)
Hemoglobin: 9 g/dL — ABNORMAL LOW (ref 12.0–15.0)
MCH: 17.8 pg — ABNORMAL LOW (ref 26.0–34.0)
MCHC: 28.4 g/dL — ABNORMAL LOW (ref 30.0–36.0)
MCV: 62.8 fL — AB (ref 78.0–100.0)
Platelets: 283 10*3/uL (ref 150–400)
RBC: 5.05 MIL/uL (ref 3.87–5.11)
RDW: 19.2 % — AB (ref 11.5–15.5)
WBC: 6.2 10*3/uL (ref 4.0–10.5)

## 2014-02-02 LAB — PREGNANCY, URINE: PREG TEST UR: NEGATIVE

## 2014-02-02 SURGERY — HYSTEROSCOPY WITH NOVASURE
Anesthesia: General | Site: Vagina

## 2014-02-02 MED ORDER — MIDAZOLAM HCL 2 MG/2ML IJ SOLN
INTRAMUSCULAR | Status: DC | PRN
  Administered 2014-02-02: 2 mg via INTRAVENOUS

## 2014-02-02 MED ORDER — FENTANYL CITRATE 0.05 MG/ML IJ SOLN
INTRAMUSCULAR | Status: AC
  Administered 2014-02-02: 50 ug via INTRAVENOUS
  Filled 2014-02-02: qty 2

## 2014-02-02 MED ORDER — LIDOCAINE HCL (CARDIAC) 20 MG/ML IV SOLN
INTRAVENOUS | Status: AC
  Filled 2014-02-02: qty 5

## 2014-02-02 MED ORDER — GLYCOPYRROLATE 0.2 MG/ML IJ SOLN
INTRAMUSCULAR | Status: AC
  Filled 2014-02-02: qty 1

## 2014-02-02 MED ORDER — FENTANYL CITRATE 0.05 MG/ML IJ SOLN
25.0000 ug | INTRAMUSCULAR | Status: DC | PRN
  Administered 2014-02-02: 50 ug via INTRAVENOUS

## 2014-02-02 MED ORDER — ONDANSETRON HCL 4 MG/2ML IJ SOLN
INTRAMUSCULAR | Status: DC | PRN
  Administered 2014-02-02: 4 mg via INTRAVENOUS

## 2014-02-02 MED ORDER — FENTANYL CITRATE 0.05 MG/ML IJ SOLN
INTRAMUSCULAR | Status: AC
  Filled 2014-02-02: qty 2

## 2014-02-02 MED ORDER — MIDAZOLAM HCL 2 MG/2ML IJ SOLN
INTRAMUSCULAR | Status: AC
  Filled 2014-02-02: qty 2

## 2014-02-02 MED ORDER — METOCLOPRAMIDE HCL 5 MG/ML IJ SOLN
10.0000 mg | Freq: Once | INTRAMUSCULAR | Status: AC | PRN
  Administered 2014-02-02: 10 mg via INTRAVENOUS

## 2014-02-02 MED ORDER — ONDANSETRON HCL 4 MG/2ML IJ SOLN
INTRAMUSCULAR | Status: AC
  Filled 2014-02-02: qty 2

## 2014-02-02 MED ORDER — MEPERIDINE HCL 25 MG/ML IJ SOLN: 6.2500 mg | INTRAMUSCULAR | Status: DC | PRN

## 2014-02-02 MED ORDER — LACTATED RINGERS IV SOLN
INTRAVENOUS | Status: DC
  Administered 2014-02-02 (×2): via INTRAVENOUS

## 2014-02-02 MED ORDER — METOCLOPRAMIDE HCL 5 MG/ML IJ SOLN
INTRAMUSCULAR | Status: AC
  Filled 2014-02-02: qty 2

## 2014-02-02 MED ORDER — IBUPROFEN 600 MG PO TABS: 600.0000 mg | ORAL_TABLET | Freq: Four times a day (QID) | ORAL | Status: DC | PRN

## 2014-02-02 MED ORDER — PROPOFOL 10 MG/ML IV BOLUS
INTRAVENOUS | Status: DC | PRN
  Administered 2014-02-02: 200 mg via INTRAVENOUS

## 2014-02-02 MED ORDER — OXYCODONE-ACETAMINOPHEN 5-325 MG PO TABS
1.0000 | ORAL_TABLET | ORAL | Status: DC | PRN
Start: 2014-02-02 — End: 2016-11-10

## 2014-02-02 MED ORDER — PROPOFOL 10 MG/ML IV EMUL
INTRAVENOUS | Status: AC
  Filled 2014-02-02: qty 20

## 2014-02-02 MED ORDER — SCOPOLAMINE 1 MG/3DAYS TD PT72
MEDICATED_PATCH | TRANSDERMAL | Status: AC
  Filled 2014-02-02: qty 1

## 2014-02-02 MED ORDER — GLYCOPYRROLATE 0.2 MG/ML IJ SOLN
INTRAMUSCULAR | Status: DC | PRN
  Administered 2014-02-02: 0.2 mg via INTRAVENOUS

## 2014-02-02 MED ORDER — SCOPOLAMINE 1 MG/3DAYS TD PT72
1.0000 | MEDICATED_PATCH | Freq: Once | TRANSDERMAL | Status: AC
  Administered 2014-02-02: 1 via TRANSDERMAL
  Administered 2014-02-02: 1.5 mg via TRANSDERMAL

## 2014-02-02 MED ORDER — DOCUSATE SODIUM 100 MG PO CAPS: 100.0000 mg | ORAL_CAPSULE | Freq: Two times a day (BID) | ORAL | Status: DC | PRN

## 2014-02-02 MED ORDER — CHLOROPROCAINE HCL 1 % IJ SOLN
INTRAMUSCULAR | Status: AC
  Filled 2014-02-02: qty 30

## 2014-02-02 MED ORDER — LIDOCAINE HCL (CARDIAC) 20 MG/ML IV SOLN
INTRAVENOUS | Status: DC | PRN
Start: 2014-02-02 — End: 2014-02-02
  Administered 2014-02-02: 80 mg via INTRAVENOUS

## 2014-02-02 MED ORDER — CHLOROPROCAINE HCL 1 % IJ SOLN
INTRAMUSCULAR | Status: DC | PRN
  Administered 2014-02-02: 10 mL

## 2014-02-02 MED ORDER — DEXAMETHASONE SODIUM PHOSPHATE 10 MG/ML IJ SOLN
INTRAMUSCULAR | Status: AC
  Filled 2014-02-02: qty 1

## 2014-02-02 MED ORDER — DEXAMETHASONE SODIUM PHOSPHATE 10 MG/ML IJ SOLN
INTRAMUSCULAR | Status: DC | PRN
  Administered 2014-02-02: 5 mg via INTRAVENOUS

## 2014-02-02 MED ORDER — FENTANYL CITRATE 0.05 MG/ML IJ SOLN
INTRAMUSCULAR | Status: DC | PRN
  Administered 2014-02-02: 25 ug via INTRAVENOUS
  Administered 2014-02-02: 50 ug via INTRAVENOUS
  Administered 2014-02-02: 25 ug via INTRAVENOUS

## 2014-02-02 SURGICAL SUPPLY — 18 items
ABLATOR ENDOMETRIAL BIPOLAR (ABLATOR) ×3 IMPLANT
CATH ROBINSON RED A/P 16FR (CATHETERS) ×3 IMPLANT
CLOTH BEACON ORANGE TIMEOUT ST (SAFETY) ×3 IMPLANT
CONTAINER PREFILL 10% NBF 60ML (FORM) IMPLANT
DRAPE HYSTEROSCOPY (DRAPE) ×3 IMPLANT
DRSG TELFA 3X8 NADH (GAUZE/BANDAGES/DRESSINGS) ×3 IMPLANT
GLOVE BIOGEL PI IND STRL 6.5 (GLOVE) ×1 IMPLANT
GLOVE BIOGEL PI INDICATOR 6.5 (GLOVE) ×2
GLOVE SURG SS PI 6.0 STRL IVOR (GLOVE) ×3 IMPLANT
GOWN STRL REUS W/TWL LRG LVL3 (GOWN DISPOSABLE) ×9 IMPLANT
PACK VAGINAL MINOR WOMEN LF (CUSTOM PROCEDURE TRAY) ×3 IMPLANT
PAD DRESSING TELFA 3X8 NADH (GAUZE/BANDAGES/DRESSINGS) ×1 IMPLANT
PAD OB MATERNITY 4.3X12.25 (PERSONAL CARE ITEMS) ×3 IMPLANT
SET GENESYS HTA PROCERVA (MISCELLANEOUS) ×2 IMPLANT
SET TUBING HYSTEROSCOPY 2 NDL (TUBING) ×2 IMPLANT
TOWEL OR 17X24 6PK STRL BLUE (TOWEL DISPOSABLE) ×6 IMPLANT
TUBE HYSTEROSCOPY W Y-CONNECT (TUBING) ×2 IMPLANT
WATER STERILE IRR 1000ML POUR (IV SOLUTION) ×3 IMPLANT

## 2014-02-02 NOTE — Transfer of Care (Signed)
Immediate Anesthesia Transfer of Care Note  Patient: Tamara Short  Procedure(s) Performed: Procedure(s): Dilatation and Curretage Hysteroscopy with resection of fibroid.HYSTEROSCOPY WITH attempted novasure. HYSTEROSCOPY WITH attempted  HYDROTHERMAL ABLATION (N/A)  Patient Location: PACU  Anesthesia Type:General  Level of Consciousness: awake, alert  and oriented  Airway & Oxygen Therapy: Patient Spontanous Breathing and Patient connected to nasal cannula oxygen  Post-op Assessment: Report given to PACU RN, Post -op Vital signs reviewed and stable and Patient moving all extremities X 4  Post vital signs: Reviewed and stable  Complications: No apparent anesthesia complications

## 2014-02-02 NOTE — Op Note (Signed)
PREOPERATIVE DIAGNOSIS:  Irregular uterine bleeding. POSTOPERATIVE DIAGNOSIS: The same PROCEDURE: Attempted endometrial ablation with hysteroscopy, D&C, myomectomy SURGEON:  Dr. Mora Bellman   INDICATIONS: 53 y.o. yo [id number]  here for hysteroscopy with endometrial ablation .Risks of surgery were discussed with the patient including but not limited to: bleeding which may require transfusion; infection which may require antibiotics; injury to uterus or surrounding organs; need for additional procedures including laparotomy or laparoscopy; and other postoperative/anesthesia complications. Written informed consent was obtained.    FINDINGS:  A 10 size anteverted uterus.  Diffuse polypoid endometrium, 2 cm lower uterine segment pedunculated fibroid.  Normal ostia bilaterally.  ANESTHESIA:   General, paracervical block. INTRAVENOUS FLUIDS:  2000 ml of LR FLUID DEFICITS:  30 ml of Lactated Ringers ESTIMATED BLOOD LOSS:  100 ml. SPECIMENS: endometrial curetting and fibroid COMPLICATIONS:  None immediate.  PROCEDURE DETAILS:  The patient was taken to the operating room where general anesthesia was administered and was found to be adequate.  After an adequate timeout was performed, she was placed in the dorsal lithotomy position and examined; then prepped and draped in the sterile manner.   Her bladder was catheterized for an unmeasured amount of clear, yellow urine. A speculum was then placed in the patient's vagina and a single tooth tenaculum was applied to the anterior lip of the cervix.  A paracervical block using 1% Marcaine was administered.  The uterine cavity was sounded to 9 cm and the cervix was dilated manually with hagar dilators to accommodate the 2.7 mm hysteroscope.  Using the hysteroscope to determine the level of the internal os, a uterine cavity of 5 cm was determined. Once the cervix was dilated, the hysteroscope was inserted under direct visualization.  The uterine cavity was carefully  examined, both ostia were recognized, and diffusely proliferative endometrium with polypoid fragments was noted. The cervix was further dilated to 8 mm, the NovaSure device was inserted and a cavity width of 4.5 mm was determined. Cavity assessment failed on several attempts. Endometrial ablation using HTA also failed cavity assessment. Myomectomy was performed using a ring forceps. The hysteroscope was then re-introduced into the uterine cavity, confirming complete removal of lower uterine segment fibroid. Sharp curettage was performed until a gritty texture was obtained. The tenaculum was removed from the anterior lip of the cervix and the vaginal speculum was removed after noting good hemostasis.  The patient tolerated the procedure well and was taken to the recovery area awake, extubated and in stable condition.

## 2014-02-02 NOTE — Discharge Instructions (Signed)

## 2014-02-02 NOTE — Anesthesia Preprocedure Evaluation (Signed)
Anesthesia Evaluation  Patient identified by MRN, date of birth, ID band Patient awake    Reviewed: Allergy & Precautions, H&P , NPO status , Patient's Chart, lab work & pertinent test results  Airway Mallampati: III TM Distance: >3 FB Neck ROM: Full    Dental no notable dental hx. (+) Teeth Intact   Pulmonary neg pulmonary ROS,  breath sounds clear to auscultation  Pulmonary exam normal       Cardiovascular negative cardio ROS  Rhythm:Regular Rate:Normal     Neuro/Psych negative neurological ROS  negative psych ROS   GI/Hepatic negative GI ROS, Neg liver ROS,   Endo/Other  Morbid obesity  Renal/GU negative Renal ROS  negative genitourinary   Musculoskeletal negative musculoskeletal ROS (+)   Abdominal (+) + obese,   Peds  Hematology  (+) anemia ,   Anesthesia Other Findings   Reproductive/Obstetrics Menorrhagia DUB                           Anesthesia Physical Anesthesia Plan  ASA: III  Anesthesia Plan: General   Post-op Pain Management:    Induction: Intravenous  Airway Management Planned: LMA  Additional Equipment:   Intra-op Plan:   Post-operative Plan: Extubation in OR  Informed Consent: I have reviewed the patients History and Physical, chart, labs and discussed the procedure including the risks, benefits and alternatives for the proposed anesthesia with the patient or authorized representative who has indicated his/her understanding and acceptance.   Dental advisory given  Plan Discussed with: Anesthesiologist, CRNA and Surgeon  Anesthesia Plan Comments:         Anesthesia Quick Evaluation

## 2014-02-02 NOTE — Anesthesia Postprocedure Evaluation (Signed)
  Anesthesia Post-op Note  Patient: Tamara Short  Procedure(s) Performed: Procedure(s): Dilatation and Curretage Hysteroscopy with resection of fibroid.HYSTEROSCOPY WITH attempted novasure. HYSTEROSCOPY WITH attempted  HYDROTHERMAL ABLATION (N/A)  Patient Location: PACU  Anesthesia Type:General  Level of Consciousness: awake, alert  and oriented  Airway and Oxygen Therapy: Patient Spontanous Breathing  Post-op Pain: mild  Post-op Assessment: Post-op Vital signs reviewed, Patient's Cardiovascular Status Stable, Respiratory Function Stable, Patent Airway, No signs of Nausea or vomiting and Pain level controlled  Post-op Vital Signs: Reviewed and stable  Last Vitals:  Filed Vitals:   02/02/14 1045  BP: 139/79  Pulse: 58  Temp:   Resp: 19    Complications: No apparent anesthesia complications

## 2014-02-03 ENCOUNTER — Encounter: Payer: Self-pay | Admitting: *Deleted

## 2014-02-03 ENCOUNTER — Encounter (HOSPITAL_COMMUNITY): Payer: Self-pay | Admitting: Obstetrics and Gynecology

## 2014-02-15 ENCOUNTER — Encounter (HOSPITAL_COMMUNITY): Payer: Self-pay | Admitting: Obstetrics and Gynecology

## 2014-02-22 ENCOUNTER — Ambulatory Visit: Payer: Medicaid Other | Admitting: Obstetrics and Gynecology

## 2014-02-22 ENCOUNTER — Telehealth: Payer: Self-pay

## 2014-02-22 NOTE — Telephone Encounter (Signed)
Patient missed post op appointment today. Called patient who stated she needs to reschedule. Informed her front office staff will call her with new appointment and if she does not hear from clinic by Friday to call to ensure appointment is scheduled. Message sent to admin pool to reschedule patient's appointment.

## 2014-02-25 ENCOUNTER — Ambulatory Visit (INDEPENDENT_AMBULATORY_CARE_PROVIDER_SITE_OTHER): Payer: Medicaid Other | Admitting: Obstetrics and Gynecology

## 2014-02-25 ENCOUNTER — Encounter: Payer: Self-pay | Admitting: Obstetrics and Gynecology

## 2014-02-25 VITALS — BP 119/75 | HR 92 | Temp 98.8°F | Ht 64.0 in | Wt 258.3 lb

## 2014-02-25 DIAGNOSIS — Z9889 Other specified postprocedural states: Secondary | ICD-10-CM

## 2014-02-25 MED ORDER — MEDROXYPROGESTERONE ACETATE 10 MG PO TABS: 10.0000 mg | ORAL_TABLET | Freq: Every day | ORAL | Status: DC

## 2014-02-25 NOTE — Progress Notes (Signed)
Patient ID: Tamara Short, female   DOB: 01/09/1961, 53 y.o.   MRN: [id number] 53 yo here for postop check s/p failed endometrial ablation with removal of submucosal fibroid on 02/02/2014. Patient reports persistent vaginal spotting since the procedure  Past Medical History  Diagnosis Date  . Fibroid   . Medical history non-contributory    Past Surgical History  Procedure Laterality Date  . No past surgeries    . Cyst removed Left 1983  . Hysteroscopy with novasure N/A 02/02/2014    Procedure: Dilatation and Curretage Hysteroscopy with resection of fibroid.HYSTEROSCOPY WITH attempted novasure. HYSTEROSCOPY WITH attempted  HYDROTHERMAL ABLATION;  Surgeon: Mora Bellman, MD;  Location: [facility];  Service: Gynecology;  Laterality: N/A;   Family History  Problem Relation Age of Onset  . Alcohol abuse Neg Hx   . Arthritis Neg Hx   . Asthma Neg Hx   . Birth defects Neg Hx   . Cancer Neg Hx   . COPD Neg Hx   . Depression Neg Hx   . Diabetes Neg Hx   . Drug abuse Neg Hx   . Early death Neg Hx   . Hearing loss Neg Hx   . Heart disease Neg Hx   . Hyperlipidemia Neg Hx   . Hypertension Neg Hx   . Kidney disease Neg Hx   . Learning disabilities Neg Hx   . Mental illness Neg Hx   . Mental retardation Neg Hx   . Miscarriages / Stillbirths Neg Hx   . Stroke Neg Hx   . Vision loss Neg Hx   . Varicose Veins Neg Hx    History  Substance Use Topics  . Smoking status: Never Smoker   . Smokeless tobacco: Never Used  . Alcohol Use: No   GENERAL: Well-developed, well-nourished female in no acute distress.  ABDOMEN: Soft, nontender, nondistended. No organomegaly. PELVIC: Normal external female genitalia. Vagina is pink and rugated.  Normal discharge. Normal appearing cervix. Uterus is normal in size. No adnexal mass or tenderness. EXTREMITIES: No cyanosis, clubbing, or edema, 2+ distal pulses.  A/P 53 yo here for post op check - Patient reports significant improvement in her bleeding  pattern. She is however still bothered by the occasional spotting. Rx provera provided - patient encouraged to keep a menstrual calendar and to return if bleeding worsens - Continue depo-provera - RTC prn

## 2014-04-01 ENCOUNTER — Ambulatory Visit: Payer: Medicaid Other

## 2014-04-01 ENCOUNTER — Ambulatory Visit (INDEPENDENT_AMBULATORY_CARE_PROVIDER_SITE_OTHER): Payer: Medicaid Other

## 2014-04-01 VITALS — BP 132/71 | HR 91 | Temp 98.0°F | Wt 262.5 lb

## 2014-04-01 DIAGNOSIS — N921 Excessive and frequent menstruation with irregular cycle: Secondary | ICD-10-CM | POA: Diagnosis not present

## 2014-04-01 DIAGNOSIS — N939 Abnormal uterine and vaginal bleeding, unspecified: Secondary | ICD-10-CM

## 2014-04-01 MED ORDER — MEDROXYPROGESTERONE ACETATE 150 MG/ML IM SUSP
150.0000 mg | Freq: Once | INTRAMUSCULAR | Status: AC
  Administered 2014-04-01: 150 mg via INTRAMUSCULAR

## 2014-04-01 NOTE — Progress Notes (Signed)
Patient here today for depo provera injection. Depo provera 150mg  administered into R deltoid. Patient tolerated well. No questions or concerns to report. Patient to return between 06/18/14-07/02/14 for next injection.

## 2014-06-18 ENCOUNTER — Encounter: Payer: Self-pay | Admitting: *Deleted

## 2014-06-18 ENCOUNTER — Ambulatory Visit (INDEPENDENT_AMBULATORY_CARE_PROVIDER_SITE_OTHER): Payer: Medicaid Other | Admitting: *Deleted

## 2014-06-18 VITALS — BP 134/69 | HR 83 | Temp 98.9°F | Wt 253.5 lb

## 2014-06-18 DIAGNOSIS — N938 Other specified abnormal uterine and vaginal bleeding: Secondary | ICD-10-CM

## 2014-06-18 MED ORDER — MEDROXYPROGESTERONE ACETATE 150 MG/ML IM SUSP
150.0000 mg | Freq: Once | INTRAMUSCULAR | Status: AC
  Administered 2014-06-18: 150 mg via INTRAMUSCULAR

## 2014-06-18 NOTE — Progress Notes (Signed)
Depo Provera 150mg  im given

## 2014-09-09 ENCOUNTER — Ambulatory Visit (INDEPENDENT_AMBULATORY_CARE_PROVIDER_SITE_OTHER): Payer: Medicaid Other | Admitting: General Practice

## 2014-09-09 VITALS — BP 121/75 | HR 78 | Temp 98.2°F | Ht 63.0 in | Wt 247.1 lb

## 2014-09-09 DIAGNOSIS — N938 Other specified abnormal uterine and vaginal bleeding: Secondary | ICD-10-CM | POA: Diagnosis not present

## 2014-09-09 MED ORDER — MEDROXYPROGESTERONE ACETATE 150 MG/ML IM SUSP
150.0000 mg | Freq: Once | INTRAMUSCULAR | Status: AC
  Administered 2014-09-09: 150 mg via INTRAMUSCULAR

## 2014-11-25 ENCOUNTER — Ambulatory Visit (INDEPENDENT_AMBULATORY_CARE_PROVIDER_SITE_OTHER): Payer: Self-pay | Admitting: General Practice

## 2014-11-25 VITALS — BP 132/67 | HR 81 | Temp 98.7°F | Ht 65.0 in | Wt 257.1 lb

## 2014-11-25 DIAGNOSIS — N921 Excessive and frequent menstruation with irregular cycle: Secondary | ICD-10-CM

## 2014-11-25 MED ORDER — MEDROXYPROGESTERONE ACETATE 150 MG/ML IM SUSP
150.0000 mg | Freq: Once | INTRAMUSCULAR | Status: AC
  Administered 2014-11-25: 150 mg via INTRAMUSCULAR

## 2015-02-10 ENCOUNTER — Ambulatory Visit: Payer: Self-pay

## 2016-02-11 ENCOUNTER — Encounter (HOSPITAL_COMMUNITY): Payer: Self-pay | Admitting: Emergency Medicine

## 2016-02-11 ENCOUNTER — Emergency Department (HOSPITAL_COMMUNITY)
Admission: EM | Admit: 2016-02-11 | Discharge: 2016-02-11 | Disposition: A | Discharge status: Home / Self Care | Payer: Self-pay | Attending: Emergency Medicine | Admitting: Emergency Medicine

## 2016-02-11 ENCOUNTER — Emergency Department (HOSPITAL_COMMUNITY): Payer: Self-pay

## 2016-02-11 DIAGNOSIS — R0789 Other chest pain: Secondary | ICD-10-CM | POA: Insufficient documentation

## 2016-02-11 DIAGNOSIS — Z79899 Other long term (current) drug therapy: Secondary | ICD-10-CM | POA: Insufficient documentation

## 2016-02-11 DIAGNOSIS — F17210 Nicotine dependence, cigarettes, uncomplicated: Secondary | ICD-10-CM | POA: Insufficient documentation

## 2016-02-11 DIAGNOSIS — R079 Chest pain, unspecified: Secondary | ICD-10-CM

## 2016-02-11 LAB — CBC
HEMATOCRIT: 45.1 % (ref 36.0–46.0)
HEMOGLOBIN: 15.1 g/dL — AB (ref 12.0–15.0)
MCH: 29.7 pg (ref 26.0–34.0)
MCHC: 33.5 g/dL (ref 30.0–36.0)
MCV: 88.6 fL (ref 78.0–100.0)
Platelets: 172 10*3/uL (ref 150–400)
RBC: 5.09 MIL/uL (ref 3.87–5.11)
RDW: 14.2 % (ref 11.5–15.5)
WBC: 7.3 10*3/uL (ref 4.0–10.5)

## 2016-02-11 LAB — BASIC METABOLIC PANEL
ANION GAP: 5 (ref 5–15)
BUN: 9 mg/dL (ref 6–20)
CHLORIDE: 109 mmol/L (ref 101–111)
CO2: 28 mmol/L (ref 22–32)
Calcium: 8.9 mg/dL (ref 8.9–10.3)
Creatinine, Ser: 1.06 mg/dL — ABNORMAL HIGH (ref 0.44–1.00)
GFR calc Af Amer: >60 mL/min (ref >60)
GFR calc non Af Amer: 59 mL/min — ABNORMAL LOW (ref >60)
Glucose, Bld: 87 mg/dL (ref 65–99)
POTASSIUM: 3.5 mmol/L (ref 3.5–5.1)
SODIUM: 142 mmol/L (ref 135–145)

## 2016-02-11 LAB — BRAIN NATRIURETIC PEPTIDE: B Natriuretic Peptide: 11.3 pg/mL (ref 0.0–100.0)

## 2016-02-11 LAB — I-STAT TROPONIN, ED
Troponin i, poc: 0 ng/mL (ref 0.00–0.08)
Troponin i, poc: 0 ng/mL (ref 0.00–0.08)

## 2016-02-11 LAB — D-DIMER, QUANTITATIVE (NOT AT ARMC): D DIMER QUANT: 0.5 ug{FEU}/mL (ref 0.00–0.50)

## 2016-02-11 MED ORDER — ASPIRIN 81 MG PO CHEW
324.0000 mg | CHEWABLE_TABLET | Freq: Once | ORAL | Status: AC
  Administered 2016-02-11: 324 mg via ORAL
  Filled 2016-02-11: qty 4

## 2016-02-11 NOTE — ED Provider Notes (Signed)
[facility] DEPT Provider Note   CSN: [id number] Arrival date & time: 02/11/16  [id number]     History   Chief Complaint Chief Complaint  Patient presents with  . Chest Pain    HPI Tamara Short is a 55 y.o. female with no significant past medical history who presents with chest pain. Patient reports that several hours ago, she was awoken with right-sided chest pain. She describes the pain as tightness, sharp, and pleuritic. She says it radiates towards her central chest. She denies palpitations, lightheadedness, or syncope but does report some diaphoresis. She reports it is exertional and rest makes it better. She has not taken aspirin or nitroglycerin. Denies any family history of early heart disease but the patient does smoke. She describes the pain as a 7 out of 10 in severity upon arrival but it has improved during initial evaluation. Patient denies any street of DVT/PE and denies any new leg swelling. Patient denies any recent fevers, chills, cough, rhinorrhea, congestion, shortness of breath.  The history is provided by the patient and medical records. No language interpreter was used.  Chest Pain   This is a new problem. The current episode started 6 to 12 hours ago. The problem occurs constantly. The problem has been gradually improving. The pain is associated with exertion. The pain is present in the lateral region. The pain is at a severity of 7/10. The pain is moderate. The quality of the pain is described as sharp. Radiates to: to middle of chest. Associated symptoms include diaphoresis. Pertinent negatives include no abdominal pain, no back pain, no cough, no dizziness, no fever, no headaches, no lower extremity edema, no nausea, no numbness, no palpitations, no shortness of breath, no syncope, no vomiting and no weakness. She has tried nothing for the symptoms. The treatment provided no relief. Risk factors include obesity.  Pertinent negatives for past medical history include no  CAD, no cancer, no congenital heart disease, no COPD, no CHF and no PE.  Pertinent negatives for family medical history include: no heart disease.    Past Medical History:  Diagnosis Date  . Fibroid   . Medical history non-contributory     Patient Active Problem List   Diagnosis Date Noted  . Abnormal uterine bleeding 09/30/2013  . Menorrhagia 10/31/2012  . Anemia 10/31/2012  . OBESITY, NOS 06/13/2006    Past Surgical History:  Procedure Laterality Date  . cyst removed Left 1983  . HYSTEROSCOPY WITH NOVASURE N/A 02/02/2014   Procedure: Dilatation and Curretage Hysteroscopy with resection of fibroid.HYSTEROSCOPY WITH attempted novasure. HYSTEROSCOPY WITH attempted  HYDROTHERMAL ABLATION;  Surgeon: Mora Bellman, MD;  Location: [facility];  Service: Gynecology;  Laterality: N/A;  . NO PAST SURGERIES      OB History    Gravida Para Term Preterm AB Living   6 4 4  0 2 4   SAB TAB Ectopic Multiple Live Births   0 2 0 0         Home Medications    Prior to Admission medications   Medication Sig Start Date End Date Taking? Authorizing Provider  docusate sodium (COLACE) 100 MG capsule Take 1 capsule (100 mg total) by mouth 2 (two) times daily as needed. Patient not taking: Reported on 02/11/2016 02/02/14   Mora Bellman, MD  ibuprofen (ADVIL,MOTRIN) 600 MG tablet Take 1 tablet (600 mg total) by mouth every 6 (six) hours as needed. Patient not taking: Reported on 02/11/2016 02/02/14   Mora Bellman, MD  medroxyPROGESTERone (PROVERA) 10  MG tablet Take 1 tablet (10 mg total) by mouth daily. Patient not taking: Reported on 02/11/2016 02/25/14   Mora Bellman, MD  oxyCODONE-acetaminophen (PERCOCET/ROXICET) 5-325 MG per tablet Take 1 tablet by mouth every 4 (four) hours as needed. Patient not taking: Reported on 02/11/2016 02/02/14   Mora Bellman, MD    Family History Family History  Problem Relation Age of Onset  . Alcohol abuse Neg Hx   . Arthritis Neg Hx   . Asthma Neg Hx    . Birth defects Neg Hx   . Cancer Neg Hx   . COPD Neg Hx   . Depression Neg Hx   . Diabetes Neg Hx   . Drug abuse Neg Hx   . Early death Neg Hx   . Hearing loss Neg Hx   . Heart disease Neg Hx   . Hyperlipidemia Neg Hx   . Hypertension Neg Hx   . Kidney disease Neg Hx   . Learning disabilities Neg Hx   . Mental illness Neg Hx   . Mental retardation Neg Hx   . Miscarriages / Stillbirths Neg Hx   . Stroke Neg Hx   . Vision loss Neg Hx   . Varicose Veins Neg Hx     Social History Social History  Substance Use Topics  . Smoking status: Current Every Day Smoker    Types: Cigarettes  . Smokeless tobacco: Never Used  . Alcohol use No     Allergies   Review of patient's allergies indicates no known allergies.   Review of Systems Review of Systems  Constitutional: Positive for diaphoresis. Negative for activity change, appetite change, chills, fatigue and fever.  HENT: Negative for congestion and rhinorrhea.   Eyes: Negative for visual disturbance.  Respiratory: Negative for cough, chest tightness, shortness of breath and stridor.   Cardiovascular: Positive for chest pain. Negative for palpitations, leg swelling and syncope.  Gastrointestinal: Negative for abdominal distention, abdominal pain, constipation, diarrhea, nausea and vomiting.  Genitourinary: Negative for difficulty urinating, dysuria, flank pain, frequency, hematuria, menstrual problem, pelvic pain, vaginal bleeding and vaginal discharge.  Musculoskeletal: Negative for back pain and neck pain.  Skin: Negative for rash and wound.  Neurological: Negative for dizziness, weakness, light-headedness, numbness and headaches.  Psychiatric/Behavioral: Negative for agitation and confusion.  All other systems reviewed and are negative.    Physical Exam Updated Vital Signs There were no vitals taken for this visit.  Physical Exam  Constitutional: She is oriented to person, place, and time. She appears well-developed  and well-nourished. No distress.  HENT:  Head: Normocephalic and atraumatic.  Mouth/Throat: Oropharynx is clear and moist. No oropharyngeal exudate.  Eyes: Conjunctivae and EOM are normal. Pupils are equal, round, and reactive to light.  Neck: Normal range of motion. Neck supple.  Cardiovascular: Normal rate, regular rhythm, normal heart sounds and intact distal pulses.   No murmur heard. Pulmonary/Chest: Effort normal and breath sounds normal. No respiratory distress. She has no wheezes. She exhibits no tenderness.  Abdominal: Soft. There is no tenderness.  Musculoskeletal: She exhibits no edema or tenderness.  Neurological: She is alert and oriented to person, place, and time. She has normal reflexes. She displays normal reflexes. No cranial nerve deficit. She exhibits normal muscle tone. Coordination normal.  Skin: Skin is warm and dry. Capillary refill takes less than 2 seconds. No rash noted.  Psychiatric: She has a normal mood and affect.  Nursing note and vitals reviewed.    ED Treatments / Results  Labs (  all labs ordered are listed, but only abnormal results are displayed) Labs Reviewed  BASIC METABOLIC PANEL - Abnormal; Notable for the following:       Result Value   Creatinine, Ser 1.06 (*)    GFR calc non Af Amer 59 (*)    All other components within normal limits  CBC - Abnormal; Notable for the following:    Hemoglobin 15.1 (*)    All other components within normal limits  D-DIMER, QUANTITATIVE (NOT AT [facility])  BRAIN NATRIURETIC PEPTIDE  I-STAT TROPOININ, ED  I-STAT TROPOININ, ED    EKG  EKG Interpretation  Date/Time:  Saturday February 11 2016 07:52:28 EDT Ventricular Rate:  68 PR Interval:  160 QRS Duration: 84 QT Interval:  430 QTC Calculation: 457 R Axis:   55 Text Interpretation:  Normal sinus rhythm Possible Left atrial enlargement Borderline ECG ?S1Q3T3 when compared to prior on 09/22/15 No STEMI Confirmed by [facility] MD, [place] [phone]) on 02/11/2016  8:49:34 AM       Radiology Dg Chest 2 View  Result Date: 02/11/2016 CLINICAL DATA:  Chest pain EXAM: CHEST  2 VIEW COMPARISON:  09/21/2012 chest radiograph. FINDINGS: Stable cardiomediastinal silhouette with normal heart size. No pneumothorax. No pleural effusion. Lungs appear clear, with no acute consolidative airspace disease and no pulmonary edema. IMPRESSION: No active cardiopulmonary disease. Electronically Signed   By: Ilona Sorrel M.D.   On: 02/11/2016 08:44    Procedures Procedures (including critical care time)  Medications Ordered in ED Medications  aspirin chewable tablet 324 mg (324 mg Oral Given 02/11/16 0840)     Initial Impression / Assessment and Plan / ED Course  I have reviewed the triage vital signs and the nursing notes.  Pertinent labs & imaging results that were available during my care of the patient were reviewed by me and considered in my medical decision making (see chart for details).  Clinical Course    Tamara Short is a 55 y.o. female with no significant past medical history who presents with chest pain.  History and exam are seen above.  Patient's exam completely unremarkable. Chest nontender to palpation. Lungs clear. Abdomen nontender. Legs not edematous with no tenderness. Neuro exam intact.   Patient was given aspirin during initial workup. Based on description of symptoms, PE considered, we will obtain d-dimer. EKG showed questionable S1/Q3/T3 when compared to prior. Patient will have full chest pain workup. Pt has no chest pain on evaluation.  Diagnostic workup results are seen above. Chest x-ray negative for abnormality, d-dimer at upper limit of normal.  BNP nonelevated, CBC and BMP without significant abnormality. Troponin negative 2.   Patient's hear score was 3. Given reassuring workup with negative troponins 2, and with negative lab testing, patient felt appropriate for discharge. Patient will follow-up with her PCP in the next  several days for further chest pain management. Patient given return precautions for any new or worsening symptoms. Patient had no other questions or concerns and was discharged in good condition.    Final Clinical Impressions(s) / ED Diagnoses   Final diagnoses:  Chest pain, unspecified type    New Prescriptions Discharge Medication List as of 02/11/2016 12:15 PM     Clinical Impression: 1. Chest pain, unspecified type     Disposition: Discharge  Condition: Good  I have discussed the results, Dx and Tx plan with the pt(& family if present). He/she/they expressed understanding and agree(s) with the plan. Discharge instructions discussed at great length. Strict return precautions discussed  and pt &/or family have verbalized understanding of the instructions. No further questions at time of discharge.    Discharge Medication List as of 02/11/2016 12:15 PM      Follow Up: O'Neill Wesson [phone] Arp, MD 02/11/16 2104

## 2016-02-11 NOTE — ED Triage Notes (Signed)
Patient c/o right sided chest pain that woke her up out of her sleep this morning. Patient denies radiation or n/v, dizziness.  Patient states that "little bit of smoker".

## 2016-11-10 ENCOUNTER — Encounter (HOSPITAL_COMMUNITY): Payer: Self-pay | Admitting: Emergency Medicine

## 2016-11-10 ENCOUNTER — Emergency Department (HOSPITAL_COMMUNITY)
Admission: EM | Admit: 2016-11-10 | Discharge: 2016-11-10 | Disposition: A | Discharge status: Other Acute Inpatient Hospital | Payer: Medicaid Other | Attending: Emergency Medicine | Admitting: Emergency Medicine

## 2016-11-10 ENCOUNTER — Encounter (HOSPITAL_COMMUNITY): Payer: Self-pay

## 2016-11-10 ENCOUNTER — Ambulatory Visit (HOSPITAL_COMMUNITY)
Admission: EM | Admit: 2016-11-10 | Discharge: 2016-11-10 | Disposition: A | Discharge status: Nursing Home (Includes ICF) | Payer: Medicaid Other

## 2016-11-10 ENCOUNTER — Inpatient Hospital Stay (HOSPITAL_COMMUNITY)
Admission: AD | Admit: 2016-11-10 | Discharge: 2016-11-15 | DRG: 885 | Disposition: A | Discharge status: Rehabilitation Facility | Payer: Federal, State, Local not specified - Other | Source: Intra-hospital | Attending: Psychiatry | Admitting: Psychiatry

## 2016-11-10 DIAGNOSIS — F419 Anxiety disorder, unspecified: Secondary | ICD-10-CM | POA: Diagnosis present

## 2016-11-10 DIAGNOSIS — F1994 Other psychoactive substance use, unspecified with psychoactive substance-induced mood disorder: Secondary | ICD-10-CM | POA: Diagnosis not present

## 2016-11-10 DIAGNOSIS — R45851 Suicidal ideations: Secondary | ICD-10-CM | POA: Diagnosis present

## 2016-11-10 DIAGNOSIS — F17210 Nicotine dependence, cigarettes, uncomplicated: Secondary | ICD-10-CM | POA: Insufficient documentation

## 2016-11-10 DIAGNOSIS — F1010 Alcohol abuse, uncomplicated: Secondary | ICD-10-CM

## 2016-11-10 DIAGNOSIS — F322 Major depressive disorder, single episode, severe without psychotic features: Principal | ICD-10-CM | POA: Diagnosis present

## 2016-11-10 DIAGNOSIS — Z6841 Body Mass Index (BMI) 40.0 and over, adult: Secondary | ICD-10-CM

## 2016-11-10 DIAGNOSIS — F1414 Cocaine abuse with cocaine-induced mood disorder: Secondary | ICD-10-CM | POA: Diagnosis not present

## 2016-11-10 DIAGNOSIS — F329 Major depressive disorder, single episode, unspecified: Secondary | ICD-10-CM | POA: Insufficient documentation

## 2016-11-10 DIAGNOSIS — G4700 Insomnia, unspecified: Secondary | ICD-10-CM | POA: Diagnosis present

## 2016-11-10 DIAGNOSIS — Z720 Tobacco use: Secondary | ICD-10-CM

## 2016-11-10 LAB — RAPID URINE DRUG SCREEN, HOSP PERFORMED
AMPHETAMINES: NOT DETECTED
BENZODIAZEPINES: NOT DETECTED
Barbiturates: NOT DETECTED
COCAINE: POSITIVE — AB
OPIATES: NOT DETECTED
TETRAHYDROCANNABINOL: NOT DETECTED

## 2016-11-10 LAB — COMPREHENSIVE METABOLIC PANEL
ALBUMIN: 4 g/dL (ref 3.5–5.0)
ALT: 21 U/L (ref 14–54)
ANION GAP: 8 (ref 5–15)
AST: 26 U/L (ref 15–41)
Alkaline Phosphatase: 69 U/L (ref 38–126)
BUN: 10 mg/dL (ref 6–20)
CHLORIDE: 109 mmol/L (ref 101–111)
CO2: 27 mmol/L (ref 22–32)
Calcium: 9.8 mg/dL (ref 8.9–10.3)
Creatinine, Ser: 1.22 mg/dL — ABNORMAL HIGH (ref 0.44–1.00)
GFR calc Af Amer: 57 mL/min — ABNORMAL LOW (ref >60)
GFR calc non Af Amer: 49 mL/min — ABNORMAL LOW (ref >60)
GLUCOSE: 120 mg/dL — AB (ref 65–99)
Potassium: 3.5 mmol/L (ref 3.5–5.1)
SODIUM: 144 mmol/L (ref 135–145)
Total Bilirubin: 0.7 mg/dL (ref 0.3–1.2)
Total Protein: 7.9 g/dL (ref 6.5–8.1)

## 2016-11-10 LAB — CBC
HCT: 43.4 % (ref 36.0–46.0)
HEMOGLOBIN: 14.9 g/dL (ref 12.0–15.0)
MCH: 30.1 pg (ref 26.0–34.0)
MCHC: 34.3 g/dL (ref 30.0–36.0)
MCV: 87.7 fL (ref 78.0–100.0)
Platelets: 223 10*3/uL (ref 150–400)
RBC: 4.95 MIL/uL (ref 3.87–5.11)
RDW: 14.7 % (ref 11.5–15.5)
WBC: 6 10*3/uL (ref 4.0–10.5)

## 2016-11-10 LAB — ACETAMINOPHEN LEVEL

## 2016-11-10 LAB — SALICYLATE LEVEL

## 2016-11-10 LAB — ETHANOL: Alcohol, Ethyl (B): <5 mg/dL (ref <5)

## 2016-11-10 MED ORDER — LORAZEPAM 1 MG PO TABS
1.0000 mg | ORAL_TABLET | ORAL | Status: AC
  Administered 2016-11-10: 1 mg via ORAL
  Filled 2016-11-10: qty 1

## 2016-11-10 MED ORDER — ONDANSETRON 4 MG PO TBDP: 4.0000 mg | ORAL_TABLET | Freq: Four times a day (QID) | ORAL | Status: DC | PRN

## 2016-11-10 MED ORDER — VITAMIN B-1 100 MG PO TABS
100.0000 mg | ORAL_TABLET | Freq: Every day | ORAL | Status: DC
  Administered 2016-11-11 – 2016-11-12 (×2): 100 mg via ORAL
  Filled 2016-11-10 (×4): qty 1

## 2016-11-10 MED ORDER — LORAZEPAM 1 MG PO TABS: 1.0000 mg | ORAL_TABLET | Freq: Every day | ORAL | Status: DC

## 2016-11-10 MED ORDER — ENSURE ENLIVE PO LIQD: 237.0000 mL | Freq: Two times a day (BID) | ORAL | Status: DC

## 2016-11-10 MED ORDER — ADULT MULTIVITAMIN W/MINERALS CH
1.0000 | ORAL_TABLET | Freq: Every day | ORAL | Status: DC
  Administered 2016-11-11 – 2016-11-12 (×2): 1 via ORAL
  Filled 2016-11-10 (×4): qty 1

## 2016-11-10 MED ORDER — LORAZEPAM 1 MG PO TABS: 1.0000 mg | ORAL_TABLET | Freq: Three times a day (TID) | ORAL | Status: DC

## 2016-11-10 MED ORDER — LOPERAMIDE HCL 2 MG PO CAPS: 2.0000 mg | ORAL_CAPSULE | ORAL | Status: DC | PRN

## 2016-11-10 MED ORDER — ALUM & MAG HYDROXIDE-SIMETH 200-200-20 MG/5ML PO SUSP: 30.0000 mL | ORAL | Status: DC | PRN

## 2016-11-10 MED ORDER — LORAZEPAM 1 MG PO TABS
1.0000 mg | ORAL_TABLET | Freq: Four times a day (QID) | ORAL | Status: AC
  Administered 2016-11-11 – 2016-11-12 (×3): 1 mg via ORAL
  Filled 2016-11-10 (×5): qty 1

## 2016-11-10 MED ORDER — ACETAMINOPHEN 325 MG PO TABS: 650.0000 mg | ORAL_TABLET | Freq: Four times a day (QID) | ORAL | Status: DC | PRN

## 2016-11-10 MED ORDER — HYDROXYZINE HCL 25 MG PO TABS
25.0000 mg | ORAL_TABLET | Freq: Four times a day (QID) | ORAL | Status: DC | PRN
  Administered 2016-11-10: 25 mg via ORAL
  Filled 2016-11-10: qty 1

## 2016-11-10 MED ORDER — TRAZODONE HCL 50 MG PO TABS
50.0000 mg | ORAL_TABLET | Freq: Every evening | ORAL | Status: DC | PRN
  Administered 2016-11-10 – 2016-11-14 (×6): 50 mg via ORAL
  Filled 2016-11-10 (×4): qty 1
  Filled 2016-11-10 (×2): qty 28
  Filled 2016-11-10 (×9): qty 1

## 2016-11-10 MED ORDER — LORAZEPAM 1 MG PO TABS
1.0000 mg | ORAL_TABLET | Freq: Four times a day (QID) | ORAL | Status: DC | PRN
  Administered 2016-11-10: 1 mg via ORAL
  Filled 2016-11-10: qty 1

## 2016-11-10 MED ORDER — LORAZEPAM 1 MG PO TABS: 1.0000 mg | ORAL_TABLET | Freq: Two times a day (BID) | ORAL | Status: DC

## 2016-11-10 MED ORDER — MAGNESIUM HYDROXIDE 400 MG/5ML PO SUSP: 30.0000 mL | Freq: Every day | ORAL | Status: DC | PRN

## 2016-11-10 NOTE — ED Provider Notes (Signed)
[facility] DEPT Provider Note   CSN: [id number] Arrival date & time: 11/10/16  1800     History   Chief Complaint Chief Complaint  Patient presents with  . Depression  . Suicidal    HPI Tamara Short is a 56 y.o. female.  Patient is a 56 year old female who presents with depression. She states she's had worsening depression for about 3-4 months. She now is having thoughts of wanting to kill herself. She also has possible need to hurt other people when they make her angry. She doesn't have any homicidal ideations against any particular people. She has no prior diagnosis of depression but she has been depressed in the past. She has never sought treatment for it. She is not on medication for depression. She does use cocaine frequently. She denies any other drug use other than occasional marijuana. She does drink alcohol about every other day but she states that she only drinks about a cup at a time. She denies any current medical complaints. No recent illnesses. No chest pain or shortness of breath.      Past Medical History:  Diagnosis Date  . Fibroid   . Medical history non-contributory     Patient Active Problem List   Diagnosis Date Noted  . Abnormal uterine bleeding 09/30/2013  . Menorrhagia 10/31/2012  . Anemia 10/31/2012  . OBESITY, NOS 06/13/2006    Past Surgical History:  Procedure Laterality Date  . cyst removed Left 1983  . HYSTEROSCOPY WITH NOVASURE N/A 02/02/2014   Procedure: Dilatation and Curretage Hysteroscopy with resection of fibroid.HYSTEROSCOPY WITH attempted novasure. HYSTEROSCOPY WITH attempted  HYDROTHERMAL ABLATION;  Surgeon: Mora Bellman, MD;  Location: [facility];  Service: Gynecology;  Laterality: N/A;  . NO PAST SURGERIES      OB History    Gravida Para Term Preterm AB Living   6 4 4  0 2 4   SAB TAB Ectopic Multiple Live Births   0 2 0 0         Home Medications    Prior to Admission medications   Not on File    Family  History Family History  Problem Relation Age of Onset  . Alcohol abuse Neg Hx   . Arthritis Neg Hx   . Asthma Neg Hx   . Birth defects Neg Hx   . Cancer Neg Hx   . COPD Neg Hx   . Depression Neg Hx   . Diabetes Neg Hx   . Drug abuse Neg Hx   . Early death Neg Hx   . Hearing loss Neg Hx   . Heart disease Neg Hx   . Hyperlipidemia Neg Hx   . Hypertension Neg Hx   . Kidney disease Neg Hx   . Learning disabilities Neg Hx   . Mental illness Neg Hx   . Mental retardation Neg Hx   . Miscarriages / Stillbirths Neg Hx   . Stroke Neg Hx   . Vision loss Neg Hx   . Varicose Veins Neg Hx     Social History Social History  Substance Use Topics  . Smoking status: Current Every Day Smoker    Types: Cigarettes  . Smokeless tobacco: Never Used  . Alcohol use No     Allergies   Patient has no known allergies.   Review of Systems Review of Systems  Constitutional: Negative for chills, diaphoresis, fatigue and fever.  HENT: Negative for congestion, rhinorrhea and sneezing.   Eyes: Negative.   Respiratory: Negative for cough,  chest tightness and shortness of breath.   Cardiovascular: Negative for chest pain and leg swelling.  Gastrointestinal: Negative for abdominal pain, blood in stool, diarrhea, nausea and vomiting.  Genitourinary: Negative for difficulty urinating, flank pain, frequency and hematuria.  Musculoskeletal: Negative for arthralgias and back pain.  Skin: Negative for rash.  Neurological: Negative for dizziness, speech difficulty, weakness, numbness and headaches.  Psychiatric/Behavioral: Positive for dysphoric mood and suicidal ideas.     Physical Exam Updated Vital Signs BP (!) 144/83 (BP Location: Right Arm)   Pulse 92   Temp 98.2 F (36.8 C) (Oral)   Resp 18   SpO2 98%   Physical Exam  Constitutional: She is oriented to person, place, and time. She appears well-developed and well-nourished.  HENT:  Head: Normocephalic and atraumatic.  Eyes: Pupils are  equal, round, and reactive to light.  Neck: Normal range of motion. Neck supple.  Cardiovascular: Normal rate, regular rhythm and normal heart sounds.   Pulmonary/Chest: Effort normal and breath sounds normal. No respiratory distress. She has no wheezes. She has no rales. She exhibits no tenderness.  Abdominal: Soft. Bowel sounds are normal. There is no tenderness. There is no rebound and no guarding.  Musculoskeletal: Normal range of motion. She exhibits no edema.  Lymphadenopathy:    She has no cervical adenopathy.  Neurological: She is alert and oriented to person, place, and time.  Skin: Skin is warm and dry. No rash noted.  Psychiatric: She has a normal mood and affect.     ED Treatments / Results  Labs (all labs ordered are listed, but only abnormal results are displayed) Labs Reviewed  COMPREHENSIVE METABOLIC PANEL - Abnormal; Notable for the following:       Result Value   Glucose, Bld 120 (*)    Creatinine, Ser 1.22 (*)    GFR calc non Af Amer 49 (*)    GFR calc Af Amer 57 (*)    All other components within normal limits  ACETAMINOPHEN LEVEL - Abnormal; Notable for the following:    Acetaminophen (Tylenol), Serum <10 (*)    All other components within normal limits  RAPID URINE DRUG SCREEN, HOSP PERFORMED - Abnormal; Notable for the following:    Cocaine POSITIVE (*)    All other components within normal limits  ETHANOL  SALICYLATE LEVEL  CBC    EKG  EKG Interpretation None       Radiology No results found.  Procedures Procedures (including critical care time)  Medications Ordered in ED Medications  LORazepam (ATIVAN) tablet 1 mg (1 mg Oral Given 11/10/16 2158)     Initial Impression / Assessment and Plan / ED Course  I have reviewed the triage vital signs and the nursing notes.  Pertinent labs & imaging results that were available during my care of the patient were reviewed by me and considered in my medical decision making (see chart for  details).     Patient presents with depression and suicidal ideations. She is medically clear. She does have a small bump in her creatinine. I did notify her that she needs to have this rechecked by her primary care provider following discharge from her psychiatric treatment. She has been accepted by Dr. Dwyane Dee to [facility].  Final Clinical Impressions(s) / ED Diagnoses   Final diagnoses:  Suicidal ideation    New Prescriptions New Prescriptions   No medications on file     Malvin Johns, MD 11/10/16 2210

## 2016-11-10 NOTE — ED Notes (Signed)
Patient tearful and anxious. Pt verbally contracts for safety, but states she is having passive SI without a plan. Family member at bedside for support. Sitter also with patient far safety. No distress noted. Patient given meal tray and drink upon request. No distress noted.

## 2016-11-10 NOTE — ED Triage Notes (Signed)
Patient here today with complaints of depression. States it has been going on for 3 months. SI and HI no plan. Reports that she has never been treated before for depression. Sometimes hear voices.

## 2016-11-10 NOTE — ED Triage Notes (Signed)
Pt presents today with depression. States it has been going on for 3-4 months. States that she can cope with things going on her life right now. States that she has had this issue in the past but not as bad as it is currently. Has never been treated for depression. States sometimes she can hear voices.

## 2016-11-10 NOTE — ED Notes (Signed)
Bed: [facility] Expected date:  Expected time:  Means of arrival:  Comments: 

## 2016-11-10 NOTE — ED Notes (Signed)
Wanded by security 

## 2016-11-10 NOTE — ED Notes (Signed)
Patient tearful and anxious about being admitted to [facility]. Pt given anti-anxiety medication prior to leaving the unit. Daughter remains at bedside for comfort.

## 2016-11-10 NOTE — BH Assessment (Addendum)
Tele Assessment Note   Tamara Short is an 56 y.o. female who presents to the ED voluntarily accompanied by her daughter whom she provided verbal consent to be present during the assessment. Pt tearful throughout the assessment and reports that she has been depressed for the past 4-5 months. Pt reports she has been suicidal and when asked if the pt had a plan to commit suicide, pt stated "I was just going to leave. I just didn't want to be alive anymore." Pt endorses cocaine and alcohol use daily in order to cope with her depression. Pt stated "I would use drugs everyday if I could afford it."  Pt reports that she experiences increased racing negative thoughts that tell her she is "no good, worthless, never going to get her life together." Pt reports she also sometimes hears music playing when there is no music. Pt identifies her recent stressors as losing her transportation, a relationship ending several months ago, and feeling overwhelmed with financial obligations without any support. Pt reports she does not have a desire to get out of bed and reports a decreased appetite. Pt reports she has noticed that her clothes fit differently due to not eating and losing weight.    Per Lindon Romp, NP pt is recommended for inpt treatment. Pt currently being reviewed by [facility]. Sharyn Lull, RN and EDP notified of the recommendation.   Diagnosis: MDD, single episode w/ psychotic features; Cocaine Use D/O; Alcohol Use D/O  Past Medical History:  Past Medical History:  Diagnosis Date  . Fibroid   . Medical history non-contributory     Past Surgical History:  Procedure Laterality Date  . cyst removed Left 1983  . HYSTEROSCOPY WITH NOVASURE N/A 02/02/2014   Procedure: Dilatation and Curretage Hysteroscopy with resection of fibroid.HYSTEROSCOPY WITH attempted novasure. HYSTEROSCOPY WITH attempted  HYDROTHERMAL ABLATION;  Surgeon: Mora Bellman, MD;  Location: [facility];  Service: Gynecology;  Laterality: N/A;   . NO PAST SURGERIES      Family History:  Family History  Problem Relation Age of Onset  . Alcohol abuse Neg Hx   . Arthritis Neg Hx   . Asthma Neg Hx   . Birth defects Neg Hx   . Cancer Neg Hx   . COPD Neg Hx   . Depression Neg Hx   . Diabetes Neg Hx   . Drug abuse Neg Hx   . Early death Neg Hx   . Hearing loss Neg Hx   . Heart disease Neg Hx   . Hyperlipidemia Neg Hx   . Hypertension Neg Hx   . Kidney disease Neg Hx   . Learning disabilities Neg Hx   . Mental illness Neg Hx   . Mental retardation Neg Hx   . Miscarriages / Stillbirths Neg Hx   . Stroke Neg Hx   . Vision loss Neg Hx   . Varicose Veins Neg Hx     Social History:  reports that she has been smoking Cigarettes.  She has never used smokeless tobacco. She reports that she does not drink alcohol or use drugs.  Additional Social History:  Alcohol / Drug Use Pain Medications: See MAR Prescriptions: See MAR Over the Counter: See MAR History of alcohol / drug use?: Yes Longest period of sobriety (when/how long): unknown Substance #1 Name of Substance 1: Cocaine 1 - Age of First Use: 20s 1 - Amount (size/oz): pt reports "depends on what I can afford" 1 - Frequency: 2x/week 1 - Duration: ongoing 1 - Last  Use / Amount: PTA Substance #2 Name of Substance 2: Alcohol 2 - Age of First Use: teens 2 - Amount (size/oz): 4-5 beers 2 - Frequency: pt reports "everyday if I could afford it" 2 - Duration: ongoing 2 - Last Use / Amount: 11/09/16  CIWA: CIWA-Ar BP: (!) 144/83 Pulse Rate: 92 COWS:    PATIENT STRENGTHS: (choose at least two) Average or above average intelligence Capable of independent living Child psychotherapist Motivation for treatment/growth Supportive family/friends  Allergies: No Known Allergies  Home Medications:  (Not in a hospital admission)  OB/GYN Status:  No LMP recorded. Patient has had an injection.  General Assessment Data Location of Assessment: [facility] TTS  Assessment: In system Is this a Tele or Face-to-Face Assessment?: Face-to-Face Is this an Initial Assessment or a Re-assessment for this encounter?: Initial Assessment Marital status: Single Is patient pregnant?: No Pregnancy Status: No Living Arrangements: Children Can pt return to current living arrangement?: Yes Admission Status: Voluntary Is patient capable of signing voluntary admission?: Yes Referral Source: Self/Family/Friend Insurance type: Medicaid     Crisis Care Plan Living Arrangements: Children Name of Psychiatrist: none Name of Therapist: none  Education Status Is patient currently in school?: No Highest grade of school patient has completed: 12th  Risk to self with the past 6 months Suicidal Ideation: Yes-Currently Present Has patient been a risk to self within the past 6 months prior to admission? : No Suicidal Intent: No-Not Currently/Within Last 6 Months Has patient had any suicidal intent within the past 6 months prior to admission? : No Is patient at risk for suicide?: Yes Suicidal Plan?: No-Not Currently/Within Last 6 Months Has patient had any suicidal plan within the past 6 months prior to admission? : No Access to Means: No What has been your use of drugs/alcohol within the last 12 months?: reports to daily cocaine and alcohol use  Previous Attempts/Gestures: No Triggers for Past Attempts: None known Intentional Self Injurious Behavior: None Family Suicide History: No Recent stressful life event(s): Financial Problems, Loss (Comment) (loss transportation ) Persecutory voices/beliefs?: Yes Depression: Yes Depression Symptoms: Insomnia, Despondent, Tearfulness, Isolating, Guilt, Fatigue, Loss of interest in usual pleasures, Feeling worthless/self pity, Feeling angry/irritable Substance abuse history and/or treatment for substance abuse?: Yes Suicide prevention information given to non-admitted patients: Not applicable  Risk to Others within the past  6 months Homicidal Ideation: No Does patient have any lifetime risk of violence toward others beyond the six months prior to admission? : No Thoughts of Harm to Others: No Current Homicidal Intent: No Current Homicidal Plan: No Access to Homicidal Means: No History of harm to others?: No Assessment of Violence: None Noted Does patient have access to weapons?: No Criminal Charges Pending?: No Does patient have a court date: No Is patient on probation?: No  Psychosis Hallucinations: Auditory Delusions: None noted  Mental Status Report Appearance/Hygiene: In scrubs, Unremarkable Eye Contact: Good Motor Activity: Freedom of movement Speech: Logical/coherent Level of Consciousness: Alert, Crying Mood: Depressed, Despair, Helpless Affect: Depressed, Sad Anxiety Level: None Thought Processes: Relevant, Coherent Judgement: Partial Orientation: Person, Place, Situation, Time, Appropriate for developmental age Obsessive Compulsive Thoughts/Behaviors: None  Cognitive Functioning Concentration: Normal Memory: Remote Intact, Recent Intact IQ: Average Insight: Fair Impulse Control: Good Appetite: Poor Sleep: Decreased Total Hours of Sleep: 6 Vegetative Symptoms: Staying in bed  ADLScreening [facility] Assessment Services) Patient's cognitive ability adequate to safely complete daily activities?: Yes Patient able to express need for assistance with ADLs?: Yes Independently performs ADLs?: Yes (appropriate  for developmental age)  Prior Inpatient Therapy Prior Inpatient Therapy: No  Prior Outpatient Therapy Prior Outpatient Therapy: No Does patient have an ACCT team?: No Does patient have Intensive In-House Services?  : No Does patient have [facility] services? : No Does patient have P4CC services?: No  ADL Screening (condition at time of admission) Patient's cognitive ability adequate to safely complete daily activities?: Yes Is the patient deaf or have difficulty hearing?:  No Does the patient have difficulty seeing, even when wearing glasses/contacts?: No Does the patient have difficulty concentrating, remembering, or making decisions?: No Patient able to express need for assistance with ADLs?: Yes Does the patient have difficulty dressing or bathing?: No Independently performs ADLs?: Yes (appropriate for developmental age) Does the patient have difficulty walking or climbing stairs?: No Weakness of Legs: None Weakness of Arms/Hands: None  Home Assistive Devices/Equipment Home Assistive Devices/Equipment: None    Abuse/Neglect Assessment (Assessment to be complete while patient is alone) Physical Abuse: Denies Verbal Abuse: Yes, past (Comment) (in previous relationships ) Sexual Abuse: Denies Exploitation of patient/patient's resources: Denies Self-Neglect: Denies     Regulatory affairs officer (For Healthcare) Does Patient Have a Medical Advance Directive?: No Would patient like information on creating a medical advance directive?: Yes (Inpatient - patient requests chaplain consult to create a medical advance directive)    Additional Information 1:1 In Past 12 Months?: No CIRT Risk: No Elopement Risk: No Does patient have medical clearance?: Yes     Disposition:  Disposition Initial Assessment Completed for this Encounter: Yes Disposition of Patient: Inpatient treatment program Type of inpatient treatment program: Adult (per Lindon Romp, NP)  Lyanne Co 11/10/2016 8:35 PM

## 2016-11-10 NOTE — Progress Notes (Signed)
Chart reviewed and case discussed with TTS. Patient admitted for major depression and cocaine/alcohol abuse. No home medications. Placed on ativan detox protocol with scheduled ativan.

## 2016-11-11 DIAGNOSIS — F1414 Cocaine abuse with cocaine-induced mood disorder: Secondary | ICD-10-CM

## 2016-11-11 DIAGNOSIS — F419 Anxiety disorder, unspecified: Secondary | ICD-10-CM

## 2016-11-11 DIAGNOSIS — Z720 Tobacco use: Secondary | ICD-10-CM

## 2016-11-11 DIAGNOSIS — Z599 Problem related to housing and economic circumstances, unspecified: Secondary | ICD-10-CM

## 2016-11-11 DIAGNOSIS — F1994 Other psychoactive substance use, unspecified with psychoactive substance-induced mood disorder: Secondary | ICD-10-CM

## 2016-11-11 DIAGNOSIS — F1010 Alcohol abuse, uncomplicated: Secondary | ICD-10-CM

## 2016-11-11 MED ORDER — FLUOXETINE HCL 20 MG PO CAPS
20.0000 mg | ORAL_CAPSULE | Freq: Every day | ORAL | Status: DC
  Administered 2016-11-11 – 2016-11-14 (×4): 20 mg via ORAL
  Filled 2016-11-11: qty 1
  Filled 2016-11-11: qty 7
  Filled 2016-11-11 (×6): qty 1
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
## 2016-11-11 NOTE — BHH Group Notes (Signed)
[facility] LCS[facility] Group Therapy  11/11/2016 10 AM  Type of Therapy:  Group Therapy  Participation Level:  None  Participation Quality:  Drowsy  Affect:  Flat  Insight:  None shared  Engagement in Therapy:  None noted  Modes of Intervention:  Discussion, Education, Exploration, Socialization and Support  Summary of Progress/Problems: Topic for today was thoughts, feelings and emotions related to what may lie behind anger. Patient's had opportunity to identify with feelings such as: Jealousy, Hurt, Anxiety, Shame, Sadness, Fear, Frustration, Guilt, Disappointment, Worry, and Embarrassment. Group processed discrepancy and difficulty in admitting to underlying feelings. Patient slept on and off throughout group.    Sheilah Pigeon, LCSW

## 2016-11-11 NOTE — Progress Notes (Signed)
D: Patient initially asleep this AM, difficult to awaken and did not come up for meds. Did get up prior to lunch. Frequent contacts made 1:1 throughout shift. Patient verbalizes to this Probation officer that she showered prior to lunch however remains foul smelling. States she did not change paper scrubs. Patient's affect blank, flat with depressed and preoccupied mood. Per self inventory and discussions with writer, rates depression, hopelessness and anxiety all at a 10/10. Rates sleep as fair, appetite as fair, energy as high and concentration as poor.  States goal for today is to "get my rest" and "stop hearing voices." Patient states voices are "putting me down." Denies pain, physical problems. CIWA is a "0" and VSS.   A: Medicated per orders, no prns requested or required. Level III obs in place for safety. Emotional support offered and self inventory reviewed. Encouraged completion of Suicide Safety Plan and programming participation. Clean pair of scrubs provided.  R: Patient verbalizes understanding of POC. Patient endorsing passive SI but denies plan, intent and verbally contracts for safety. No HI/VH and remains safe on level III obs. Will continue to monitor closely and make verbal contact frequently.

## 2016-11-11 NOTE — Tx Team (Signed)
Initial Treatment Plan 9/70/2637 85:88 AM Tamara Short Tamara Short:[id number]    PATIENT STRESSORS: Marital or family conflict Medication change or noncompliance Substance abuse   PATIENT STRENGTHS: General fund of knowledge Motivation for treatment/growth   PATIENT IDENTIFIED PROBLEMS: "get help for my depression"  "get help with my drug problems"                   DISCHARGE CRITERIA:  Adequate post-discharge living arrangements Verbal commitment to aftercare and medication compliance Withdrawal symptoms are absent or subacute and managed without 24-hour nursing intervention  PRELIMINARY DISCHARGE PLAN: Attend aftercare/continuing care group Attend 12-step recovery group Placement in alternative living arrangements  PATIENT/FAMILY INVOLVEMENT: This treatment plan has been presented to and reviewed with the patient, Tamara Short, and/or family member, .  The patient and family have been given the opportunity to ask questions and make suggestions.  Migdalia Dk, RN 11/11/2016, 12:01 AM

## 2016-11-11 NOTE — BHH Group Notes (Signed)
Identifying Needs   Date:  11/11/2016  Time:  1300  Type of Therapy:  Nurse Education  /  The group is focused on teaching patients how to identify their needs as well as develop the skills needed to get them met.   Participation Level:  Active  Participation Quality:  Appropriate  Affect:  Appropriate  Cognitive:  Alert  Insight:  Good  Engagement in Group:  Engaged  Modes of Intervention:  Education  Summary of Progress/Problems:  Tamara Short 11/11/2016, 6:13 PM

## 2016-11-11 NOTE — H&P (Signed)
Psychiatric Admission Assessment Adult  Patient Identification: Tamara Short MRN:  [id number] Date of Evaluation:  11/11/2016 Chief Complaint:  mdd,single episode cocaine use disorder etoh use disorder Principal Diagnosis: <principal problem not specified> Diagnosis:   Patient Active Problem List   Diagnosis Date Noted  . Substance induced mood disorder ([place]) [F19.94]   . Alcohol abuse [F10.10]   . Cocaine abuse with cocaine-induced mood disorder ([place]) [F14.14]   . Nicotine abuse [Z72.0]   . Severe major depression ([place]) [F32.2] 11/10/2016  . Abnormal uterine bleeding [N93.9] 09/30/2013  . Menorrhagia [N92.0] 10/31/2012  . Anemia [D64.9] 10/31/2012  . OBESITY, NOS [E66.9] 06/13/2006   History of Present Illness: History of Present Illness: 56 year old female admitted to [facility] endorsing active suicidal ideation. Patient in and out during this admission assessment so she presents as a poor historian. She does acknowledged that prior to her admission to [facility] she was having suicidal thoughts with no plan or intent. Per admission note patient in regards to a plan, patient stated, "I was just going to leave.I just didn't want to be alive anymore. " Patient endorses a history of cocaine and alcohol abuse. She endorses that she has been self-medicating with  these substances to better deal with her depression. Her UDS on admission  was positive for cocaine. Her ethanol was <5. She endorses that she has struggled with depression for the past 3-4 months however she does not provided details to what could be triggering her thoughts or depressive symptoms.  Patient  denies any history of SA, AVH or homicidal ideas. She denies any history of self-injurious behaviors. Patient does not provide any history of psychiatric medication use. She denies a family history of mental health illness. Denies previous inpatient hospitalizations or current outpatient psychiatric outpatient care.   As per tele  assessment note; Tamara Short is an 56 y.o. female who presents to the ED voluntarily accompanied by her daughter whom she provided verbal consent to be present during the assessment. Pt tearful throughout the assessment and reports that she has been depressed for the past 4-5 months. Pt reports she has been suicidal and when asked if the pt had a plan to commit suicide, pt stated "I was just going to leave. I just didn't want to be alive anymore." Pt endorses cocaine and alcohol use daily in order to cope with her depression. Pt stated "I would use drugs everyday if I could afford it."  Pt reports that she experiences increased racing negative thoughts that tell her she is "no good, worthless, never going to get her life together." Pt reports she also sometimes hears music playing when there is no music. Pt identifies her recent stressors as losing her transportation, a relationship ending several months ago, and feeling overwhelmed with financial obligations without any support. Pt reports she does not have a desire to get out of bed and reports a decreased appetite. Pt reports she has noticed that her clothes fit differently due to not eating and losing weight.    Associated Signs/Symptoms: Depression Symptoms:  depressed mood, insomnia, feelings of worthlessness/guilt, hopelessness, suicidal thoughts without plan, anxiety, decreased appetite, (Hypo) Manic Symptoms:  none  Anxiety Symptoms:  Excessive Worry, Psychotic Symptoms:  none  PTSD Symptoms: NA Total Time spent with patient: 1 hour  Past Psychiatric History: MDD.  No previous psychiatric admission or outpatient psychiatric care..   Is the patient at risk to self? Yes.    Has the patient been a risk  to self in the past 6 months? Yes.    Has the patient been a risk to self within the distant past? Yes.    Is the patient a risk to others? No.  Has the patient been a risk to others in the past 6 months? No.  Has the patient been  a risk to others within the distant past? No.    Alcohol Screening: 1. How often do you have a drink containing alcohol?: 4 or more times a week 2. How many drinks containing alcohol do you have on a typical day when you are drinking?: 10 or more 3. How often do you have six or more drinks on one occasion?: Weekly Preliminary Score: 7 4. How often during the last year have you found that you were not able to stop drinking once you had started?: Daily or almost daily 5. How often during the last year have you failed to do what was normally expected from you becasue of drinking?: Less than monthly 6. How often during the last year have you needed a first drink in the morning to get yourself going after a heavy drinking session?: Less than monthly 7. How often during the last year have you had a feeling of guilt of remorse after drinking?: Less than monthly 8. How often during the last year have you been unable to remember what happened the night before because you had been drinking?: Never 9. Have you or someone else been injured as a result of your drinking?: No 10. Has a relative or friend or a doctor or another health worker been concerned about your drinking or suggested you cut down?: Yes, but not in the last year Alcohol Use Disorder Identification Test Final Score (AUDIT): 20 Brief Intervention: Yes Substance Abuse History in the last 12 months:  Yes.   Consequences of Substance Abuse: NA Previous Psychotropic Medications: No  Psychological Evaluations: No  Past Medical History:  Past Medical History:  Diagnosis Date  . Fibroid   . Medical history non-contributory     Past Surgical History:  Procedure Laterality Date  . cyst removed Left 1983  . HYSTEROSCOPY WITH NOVASURE N/A 02/02/2014   Procedure: Dilatation and Curretage Hysteroscopy with resection of fibroid.HYSTEROSCOPY WITH attempted novasure. HYSTEROSCOPY WITH attempted  HYDROTHERMAL ABLATION;  Surgeon: Mora Bellman, MD;   Location: [facility];  Service: Gynecology;  Laterality: N/A;  . NO PAST SURGERIES     Family History:  Family History  Problem Relation Age of Onset  . Alcohol abuse Neg Hx   . Arthritis Neg Hx   . Asthma Neg Hx   . Birth defects Neg Hx   . Cancer Neg Hx   . COPD Neg Hx   . Depression Neg Hx   . Diabetes Neg Hx   . Drug abuse Neg Hx   . Early death Neg Hx   . Hearing loss Neg Hx   . Heart disease Neg Hx   . Hyperlipidemia Neg Hx   . Hypertension Neg Hx   . Kidney disease Neg Hx   . Learning disabilities Neg Hx   . Mental illness Neg Hx   . Mental retardation Neg Hx   . Miscarriages / Stillbirths Neg Hx   . Stroke Neg Hx   . Vision loss Neg Hx   . Varicose Veins Neg Hx    Family Psychiatric  History: None per patient report  Tobacco Screening: Have you used any form of tobacco in the last 30 days? (Cigarettes,  Smokeless Tobacco, Cigars, and/or Pipes): Yes Tobacco use, Select all that apply: 5 or more cigarettes per day Are you interested in Tobacco Cessation Medications?: No, patient refused Counseled patient on smoking cessation including recognizing danger situations, developing coping skills and basic information about quitting provided: Refused/Declined practical counseling Social History:  History  Alcohol Use No     History  Drug Use No    Additional Social History:      Pain Medications: See MAR Prescriptions: See MAR Over the Counter: See MAR History of alcohol / drug use?: Yes Longest period of sobriety (when/how long): unknown Negative Consequences of Use: Personal relationships, Financial Withdrawal Symptoms: Irritability, Agitation Name of Substance 1: Cocaine 1 - Age of First Use: 20s 1 - Amount (size/oz): pt reports "depends on what I can afford" 1 - Frequency: 2x/week 1 - Duration: ongoing 1 - Last Use / Amount: PTA Name of Substance 2: Alcohol 2 - Age of First Use: teens 2 - Amount (size/oz): 4-5 beers 2 - Frequency: pt reports "everyday if I  could afford it" 2 - Duration: ongoing 2 - Last Use / Amount: 11/09/16                Allergies:  No Known Allergies Lab Results:  Results for orders placed or performed during the hospital encounter of 11/10/16 (from the past 48 hour(s))  Rapid urine drug screen (hospital performed)     Status: Abnormal   Collection Time: 11/10/16  6:20 PM  Result Value Ref Range   Opiates NONE DETECTED NONE DETECTED   Cocaine POSITIVE (A) NONE DETECTED   Benzodiazepines NONE DETECTED NONE DETECTED   Amphetamines NONE DETECTED NONE DETECTED   Tetrahydrocannabinol NONE DETECTED NONE DETECTED   Barbiturates NONE DETECTED NONE DETECTED    Comment:        DRUG SCREEN FOR MEDICAL PURPOSES ONLY.  IF CONFIRMATION IS NEEDED FOR ANY PURPOSE, NOTIFY LAB WITHIN 5 DAYS.        LOWEST DETECTABLE LIMITS FOR URINE DRUG SCREEN Drug Class       Cutoff (ng/mL) Amphetamine      1000 Barbiturate      200 Benzodiazepine   951 Tricyclics       884 Opiates          300 Cocaine          300 THC              50   Comprehensive metabolic panel     Status: Abnormal   Collection Time: 11/10/16  6:21 PM  Result Value Ref Range   Sodium 144 135 - 145 mmol/L   Potassium 3.5 3.5 - 5.1 mmol/L   Chloride 109 101 - 111 mmol/L   CO2 27 22 - 32 mmol/L   Glucose, Bld 120 (H) 65 - 99 mg/dL   BUN 10 6 - 20 mg/dL   Creatinine, Ser 1.22 (H) 0.44 - 1.00 mg/dL   Calcium 9.8 8.9 - 10.3 mg/dL   Total Protein 7.9 6.5 - 8.1 g/dL   Albumin 4.0 3.5 - 5.0 g/dL   AST 26 15 - 41 U/L   ALT 21 14 - 54 U/L   Alkaline Phosphatase 69 38 - 126 U/L   Total Bilirubin 0.7 0.3 - 1.2 mg/dL   GFR calc non Af Amer 49 (L) >60 mL/min   GFR calc Af Amer 57 (L) >60 mL/min    Comment: (NOTE) The eGFR has been calculated using the CKD EPI equation. This calculation  has not been validated in all clinical situations. eGFR's persistently <60 mL/min signify possible Chronic Kidney Disease.    Anion gap 8 5 - 15  Ethanol     Status: None    Collection Time: 11/10/16  6:21 PM  Result Value Ref Range   Alcohol, Ethyl (B) <5 <5 mg/dL    Comment:        LOWEST DETECTABLE LIMIT FOR SERUM ALCOHOL IS 5 mg/dL FOR MEDICAL PURPOSES ONLY   Salicylate level     Status: None   Collection Time: 11/10/16  6:21 PM  Result Value Ref Range   Salicylate Lvl <3.0 2.8 - 30.0 mg/dL  Acetaminophen level     Status: Abnormal   Collection Time: 11/10/16  6:21 PM  Result Value Ref Range   Acetaminophen (Tylenol), Serum <10 (L) 10 - 30 ug/mL    Comment:        THERAPEUTIC CONCENTRATIONS VARY SIGNIFICANTLY. A RANGE OF 10-30 ug/mL MAY BE AN EFFECTIVE CONCENTRATION FOR MANY PATIENTS. HOWEVER, SOME ARE BEST TREATED AT CONCENTRATIONS OUTSIDE THIS RANGE. ACETAMINOPHEN CONCENTRATIONS >150 ug/mL AT 4 HOURS AFTER INGESTION AND >50 ug/mL AT 12 HOURS AFTER INGESTION ARE OFTEN ASSOCIATED WITH TOXIC REACTIONS.   cbc     Status: None   Collection Time: 11/10/16  6:21 PM  Result Value Ref Range   WBC 6.0 4.0 - 10.5 K/uL   RBC 4.95 3.87 - 5.11 MIL/uL   Hemoglobin 14.9 12.0 - 15.0 g/dL   HCT 43.4 36.0 - 46.0 %   MCV 87.7 78.0 - 100.0 fL   MCH 30.1 26.0 - 34.0 pg   MCHC 34.3 30.0 - 36.0 g/dL   RDW 14.7 11.5 - 15.5 %   Platelets 223 150 - 400 K/uL    Blood Alcohol level:  Lab Results  Component Value Date   ETH <5 16/04/930    Metabolic Disorder Labs:  No results found for: HGBA1C, MPG No results found for: PROLACTIN No results found for: CHOL, TRIG, HDL, CHOLHDL, VLDL, LDLCALC  Current Medications: Current Facility-Administered Medications  Medication Dose Route Frequency Provider Last Rate Last Dose  . acetaminophen (TYLENOL) tablet 650 mg  650 mg Oral Q6H PRN Lindon Romp A, NP      . alum & mag hydroxide-simeth (MAALOX/MYLANTA) 200-200-20 MG/5ML suspension 30 mL  30 mL Oral Q4H PRN Lindon Romp A, NP      . FLUoxetine (PROZAC) capsule 20 mg  20 mg Oral Daily Ambrose Finland, MD   20 mg at 11/11/16 1209  . hydrOXYzine  (ATARAX/VISTARIL) tablet 25 mg  25 mg Oral Q6H PRN Lindon Romp A, NP   25 mg at 11/10/16 2340  . loperamide (IMODIUM) capsule 2-4 mg  2-4 mg Oral PRN Lindon Romp A, NP      . LORazepam (ATIVAN) tablet 1 mg  1 mg Oral Q6H PRN Lindon Romp A, NP   1 mg at 11/10/16 2340  . LORazepam (ATIVAN) tablet 1 mg  1 mg Oral QID Lindon Romp A, NP   1 mg at 11/11/16 1209   Followed by  . [START ON 11/12/2016] LORazepam (ATIVAN) tablet 1 mg  1 mg Oral TID Rozetta Nunnery, NP       Followed by  . [START ON 11/13/2016] LORazepam (ATIVAN) tablet 1 mg  1 mg Oral BID Rozetta Nunnery, NP       Followed by  . [START ON 11/15/2016] LORazepam (ATIVAN) tablet 1 mg  1 mg Oral Daily Rozetta Nunnery, NP      .  magnesium hydroxide (MILK OF MAGNESIA) suspension 30 mL  30 mL Oral Daily PRN Lindon Romp A, NP      . multivitamin with minerals tablet 1 tablet  1 tablet Oral Daily Lindon Romp A, NP   1 tablet at 11/11/16 1209  . ondansetron (ZOFRAN-ODT) disintegrating tablet 4 mg  4 mg Oral Q6H PRN Lindon Romp A, NP      . thiamine (VITAMIN B-1) tablet 100 mg  100 mg Oral Daily Lindon Romp A, NP   100 mg at 11/11/16 1209  . traZODone (DESYREL) tablet 50 mg  50 mg Oral QHS,MR X 1 Lindon Romp A, NP   50 mg at 11/10/16 2340   PTA Medications: No prescriptions prior to admission.    Musculoskeletal: Strength & Muscle Tone: within normal limits Gait & Station: normal Patient leans: N/A  Psychiatric Specialty Exam: Physical Exam  Nursing note and vitals reviewed. Constitutional: She is oriented to person, place, and time.  Neurological: She is alert and oriented to person, place, and time.    Review of Systems  Psychiatric/Behavioral: Positive for depression, substance abuse and suicidal ideas. Negative for hallucinations and memory loss. The patient is nervous/anxious. The patient does not have insomnia.   All other systems reviewed and are negative.   Blood pressure 137/88, pulse 81, temperature 97.9 F (36.6 C),  temperature source Oral, resp. rate 16, height _0  (1.6 m), weight 240 lb (108.9 kg).Body mass index is 42.51 kg/m.  General Appearance: Guarded  Eye Contact:  Minimal  Speech:  Clear and Coherent and Normal Rate  Volume:  Decreased  Mood:  Anxious, Depressed, Hopeless and Worthless  Affect:  Constricted and Depressed  Thought Process:  Coherent, Goal Directed, Linear and Descriptions of Associations: Intact  Orientation:  Full (Time, Place, and Person)  Thought Content:  WDL  Suicidal Thoughts:  Yes.  without intent/plan  Homicidal Thoughts:  No  Memory:  Immediate;   Fair Recent;   Fair  Judgement:  Impaired  Insight:  Fair  Psychomotor Activity:  Normal  Concentration:  Concentration: Fair and Attention Span: Fair  Recall:  [employer] of Knowledge:  Good  Language:  Good  Akathisia:  Negative  Handed:  Right  AIMS (if indicated):     Assets:  Desire for Improvement Resilience  ADL's:  Intact  Cognition:  WNL  Sleep:  Number of Hours: 4.75    Treatment Plan Summary: Daily contact with patient to assess and evaluate symptoms and progress in treatment  Treatment Plan/Recommendations: 1. Admit for crisis management and stabilization, estimated length of stay 3-5 days.  2. Medication management to reduce current symptoms to base line and improve the patient's overall level of functioning: Prozac 20 mg po daily for depression management. Start Trazodone 50 mg po daily at bedtime for insomnia and Vistaril 25 mg po q6hrs as needed for anxiety. ? 3. Treat health problems as indicated.  4. Develop treatment plan to decrease risk of relapse upon discharge and the need for readmission.  5. Psycho-social education regarding relapse prevention and self care.  6. Health care follow up as needed for medical problems.  7. Review, reconcile, and reinstate any pertinent home medications for other health issues where appropriate. 8. Call for consults with hospitalist for any additional  specialty patient care services as needed. 9. Begin Ativan detox protocol for withdrawal symptoms.   Observation Level/Precautions:  15 minute checks  Laboratory:  Per, UDS (+) for Cocaine. TSH, HgbA1c and lipid panel active.  Psychotherapy:  Group milieu   Medications:  See MAR  Consultations:  As needed.  Discharge Concerns:  Mood stability, maintaining sobriety & safety  Estimated LOS:2-4 days.  Other:  Admit to the 300-hall.      Physician Treatment Plan for Primary Diagnosis: <principal problem not specified> Long Term Goal(s): Improvement in symptoms so as ready for discharge  Short Term Goals: Ability to identify changes in lifestyle to reduce recurrence of condition will improve, Ability to verbalize feelings will improve, Compliance with prescribed medications will improve and Ability to identify triggers associated with substance abuse/mental health issues will improve  Physician Treatment Plan for Secondary Diagnosis: Active Problems:   Severe major depression ([place])   Substance induced mood disorder ([facility])   Alcohol abuse   Cocaine abuse with cocaine-induced mood disorder ([place])   Nicotine abuse  Long Term Goal(s): Improvement in symptoms so as ready for discharge  Short Term Goals: Ability to disclose and discuss suicidal ideas and Ability to identify and develop effective coping behaviors will improve  I certify that inpatient services furnished can reasonably be expected to improve the patient's condition.    Mordecai Maes, NP 7/29/20181:15 PM   Patient seen, chart reviewed for this face to face psych evaluation, case discussed with physician extender and formulated treatment plan. Completed and documented admission suicide risk assessment. Reviewed the information documented and agree with the treatment plan.  Dijon Cosens 11/28/2016 12:15 PM

## 2016-11-11 NOTE — Progress Notes (Signed)
Pt is a 56 year old female admitted with depression and poly substance abuse   Pt uses cocaine and ETOH   She reports increased depression ,decreased appetite and sleep , hopelessness and helplessness   Her stressors are loss of a relationship and her transportation and financial difficulties  Pt was oriented to the unit  Assessment completed   Pt received nourishment   Medications administered and effectiveness monitored   Q 15 min checks   Encouraged pt to shower as she has body odor  R   Pt is safe at present time and is in her bed resting

## 2016-11-11 NOTE — [facility] Suicide Risk Assessment (Signed)
[facility] Admission Suicide Risk Assessment   Nursing information obtained from:    Demographic factors:    Current Mental Status:    Loss Factors:    Historical Factors:    Risk Reduction Factors:     Total Time spent with patient: 45 minutes Principal Problem: <principal problem not specified> Diagnosis:   Patient Active Problem List   Diagnosis Date Noted  . Severe major depression ([place]) [F32.2] 11/10/2016  . Abnormal uterine bleeding [N93.9] 09/30/2013  . Menorrhagia [N92.0] 10/31/2012  . Anemia [D64.9] 10/31/2012  . OBESITY, NOS [E66.9] 06/13/2006   Subjective Data: Tamara Short is a 56 years old female admitted from [facility] for increased symptoms of depression, anxiety, suicide ideation without plan. She has been self medicating with alcohol and cocaine for the last one year. She has broken up relationship x 4 years. She works in [employer], and lives with her two sons (63, 63).  Continued Clinical Symptoms:  Alcohol Use Disorder Identification Test Final Score (AUDIT): 20 The "Alcohol Use Disorders Identification Test", Guidelines for Use in Primary Care, Second Edition.  World Pharmacologist [facility]). Score between 0-7:  no or low risk or alcohol related problems. Score between 8-15:  moderate risk of alcohol related problems. Score between 16-19:  high risk of alcohol related problems. Score 20 or above:  warrants further diagnostic evaluation for alcohol dependence and treatment.   CLINICAL FACTORS:   Severe Anxiety and/or Agitation Depression:   Anhedonia Comorbid alcohol abuse/dependence Hopelessness Impulsivity Insomnia Recent sense of peace/wellbeing Severe Alcohol/Substance Abuse/Dependencies More than one psychiatric diagnosis Unstable or Poor Therapeutic Relationship Previous Psychiatric Diagnoses and Treatments   Musculoskeletal: Strength & Muscle Tone: within normal limits Gait & Station: normal Patient leans: N/A  Psychiatric Specialty  Exam: Physical Exam Full physical performed in Emergency Department. I have reviewed this assessment and concur with its findings.   ROS  Has hand tremors, sweating, no nausea, vomiting, chest pain or SOB No Fever-chills, No Headache, No changes with Vision or hearing, reports vertigo No problems swallowing food or Liquids, No Chest pain, Cough or Shortness of Breath, No Abdominal pain, No Nausea or Vommitting, Bowel movements are regular, No Blood in stool or Urine, No dysuria, No new skin rashes or bruises, No new joints pains-aches,  No new weakness, tingling, numbness in any extremity, No recent weight gain or loss, No polyuria, polydypsia or polyphagia,  A full 10 point Review of Systems was done, except as stated above, all other Review of Systems were negative.  Blood pressure 115/73, pulse 97, temperature 97.9 F (36.6 C), temperature source Oral, resp. rate 16, height 5\' 3"  (1.6 m), weight 108.9 kg (240 lb).Body mass index is 42.51 kg/m.  General Appearance: Guarded  Eye Contact:  Fair  Speech:  Slow  Volume:  Decreased  Mood:  Anxious, Depressed, Hopeless and Worthless  Affect:  Constricted and Depressed  Thought Process:  Coherent and Goal Directed  Orientation:  Full (Time, Place, and Person)  Thought Content:  WDL  Suicidal Thoughts:  Yes.  without intent/plan  Homicidal Thoughts:  No  Memory:  Immediate;   Fair Recent;   Fair Remote;   Fair  Judgement:  Impaired  Insight:  Fair  Psychomotor Activity:  Decreased  Concentration:  Concentration: Fair and Attention Span: Fair  Recall:  [employer] of Knowledge:  Good  Language:  Good  Akathisia:  Negative  Handed:  Right  AIMS (if indicated):     Assets:  Communication Skills Desire  for Improvement Financial Resources/Insurance Housing Intimacy Leisure Time Physical Health Resilience Social Support Talents/Skills Transportation Vocational/Educational  ADL's:  Intact  Cognition:  WNL  Sleep:  Number  of Hours: 4.75      COGNITIVE FEATURES THAT CONTRIBUTE TO RISK:  Closed-mindedness, Loss of executive function, Polarized thinking and Thought constriction (tunnel vision)    SUICIDE RISK:   Moderate:  Frequent suicidal ideation with limited intensity, and duration, some specificity in terms of plans, no associated intent, good self-control, limited dysphoria/symptomatology, some risk factors present, and identifiable protective factors, including available and accessible social support.  PLAN OF CARE: Admit for increased symptoms of depression, anxiety and substance abuse (Niciotine, alcohol and cocaine). She needs crisis stabilization, medication management and safety monitoring.  I certify that inpatient services furnished can reasonably be expected to improve the patient's condition.   Ambrose Finland, MD 11/11/2016, 9:33 AM

## 2016-11-12 DIAGNOSIS — G4700 Insomnia, unspecified: Secondary | ICD-10-CM

## 2016-11-12 LAB — LIPID PANEL
CHOLESTEROL: 154 mg/dL (ref 0–200)
HDL: 59 mg/dL (ref >40)
LDL Cholesterol: 79 mg/dL (ref 0–99)
Total CHOL/HDL Ratio: 2.6 RATIO
Triglycerides: 81 mg/dL (ref <150)
VLDL: 16 mg/dL (ref 0–40)

## 2016-11-12 LAB — TSH: TSH: 1.097 u[IU]/mL (ref 0.350–4.500)

## 2016-11-12 MED ORDER — CHLORDIAZEPOXIDE HCL 25 MG PO CAPS
25.0000 mg | ORAL_CAPSULE | Freq: Four times a day (QID) | ORAL | Status: DC | PRN
  Administered 2016-11-13: 25 mg via ORAL
  Filled 2016-11-12: qty 1

## 2016-11-12 MED ORDER — HYDROXYZINE HCL 25 MG PO TABS
25.0000 mg | ORAL_TABLET | Freq: Four times a day (QID) | ORAL | Status: DC | PRN
  Administered 2016-11-12: 25 mg via ORAL
  Filled 2016-11-12: qty 1
  Filled 2016-11-12: qty 10

## 2016-11-12 MED ORDER — ADULT MULTIVITAMIN W/MINERALS CH
1.0000 | ORAL_TABLET | Freq: Every day | ORAL | Status: DC
  Administered 2016-11-13 – 2016-11-14 (×2): 1 via ORAL
  Filled 2016-11-12 (×5): qty 1

## 2016-11-12 MED ORDER — ONDANSETRON 4 MG PO TBDP: 4.0000 mg | ORAL_TABLET | Freq: Four times a day (QID) | ORAL | Status: DC | PRN

## 2016-11-12 MED ORDER — VITAMIN B-1 100 MG PO TABS
100.0000 mg | ORAL_TABLET | Freq: Every day | ORAL | Status: DC
  Administered 2016-11-13 – 2016-11-14 (×2): 100 mg via ORAL
  Filled 2016-11-12 (×4): qty 1

## 2016-11-12 MED ORDER — LOPERAMIDE HCL 2 MG PO CAPS: 2.0000 mg | ORAL_CAPSULE | ORAL | Status: DC | PRN

## 2016-11-12 NOTE — Progress Notes (Signed)
NUTRITION ASSESSMENT  Pt identified as at risk on the Malnutrition Screen Tool  INTERVENTION: 1. Educated patient on the importance of nutrition and encouraged intake of food and beverages. 2. Discussed weight goals. 3. Supplements: none at this time.   NUTRITION DIAGNOSIS: Unintentional weight loss related to sub-optimal intake as evidenced by pt report.   Goal: Pt to meet >/= 90% of their estimated nutrition needs.  Monitor:  PO intake  Assessment:  Pt admitted for SI. Notes indicate that pt is a poor historian. She has hx of cocaine and alcohol abuse. She self-medicates with substances to deal with depression.   No recent weight hx available. Per review, pt has lost 17 lbs (6.6% body weight) in the past ~2 years; this is not significant for time frame. Continue to encourage PO intakes of meals and snacks.     56 y.o. female  Height: Ht Readings from Last 1 Encounters:  11/10/16 5\' 3"  (1.6 m)    Weight: Wt Readings from Last 1 Encounters:  11/10/16 240 lb (108.9 kg)    Weight Hx: Wt Readings from Last 10 Encounters:  11/10/16 240 lb (108.9 kg)  11/25/14 257 lb 1.6 oz (116.6 kg)  09/09/14 247 lb 1.6 oz (112.1 kg)  06/18/14 253 lb 8 oz (115 kg)  04/01/14 262 lb 8 oz (119.1 kg)  02/25/14 258 lb 4.8 oz (117.2 kg)  02/02/14 255 lb (115.7 kg)  12/30/13 258 lb 14.4 oz (117.4 kg)  10/28/13 266 lb 8 oz (120.9 kg)  09/30/13 261 lb 11.2 oz (118.7 kg)    BMI:  Body mass index is 42.51 kg/m. Pt meets criteria for morbid obesity based on current BMI.  Estimated Nutritional Needs: Kcal: 25-30 kcal/kg Protein: > 1 gram protein/kg Fluid: 1 ml/kcal  Diet Order: Diet regular Room service appropriate? Yes; Fluid consistency: Thin Pt is also offered choice of unit snacks mid-morning and mid-afternoon.  Pt is eating as desired.   Lab results and medications reviewed.     Jarome Matin, MS, RD, LDN, [facility] Inpatient Clinical Dietitian Pager # [phone] After  hours/weekend pager # [phone]

## 2016-11-12 NOTE — Progress Notes (Signed)
[facility] MD Progress Note  6/43/3295 1:88 PM Tamara Short  MRN:  [id number]   Subjective:  Patient reports that she is feeling some better today. She said she slept good with the medication she received. She denies any withdrawals from ETOH or drug use. She states that she was drinking 2-3 beers 1-2 days a week and crack cocaine whenever she can afford it. She works at [employer] and gets paid once a week. She is losing her house because she owes $85 for mortgage and it is due tomorrow and if it is not paid then the house is going into forclosure. She is requesting residential treatment for her substance abuse. She reports passive SI but denies HI and AVH.   Objective: Patient is pleasant and cooperative. Patient appears sleepy, which is most likely due to medications.    Principal Problem: Severe major depression ([place]) Diagnosis:   Patient Active Problem List   Diagnosis Date Noted  . Substance induced mood disorder ([place]) [F19.94]   . Alcohol abuse [F10.10]   . Cocaine abuse with cocaine-induced mood disorder ([place]) [F14.14]   . Nicotine abuse [Z72.0]   . Severe major depression ([place]) [F32.2] 11/10/2016  . Abnormal uterine bleeding [N93.9] 09/30/2013  . Menorrhagia [N92.0] 10/31/2012  . Anemia [D64.9] 10/31/2012  . OBESITY, NOS [E66.9] 06/13/2006   Total Time spent with patient: 25 minutes  Past Psychiatric History: See H&P  Past Medical History:  Past Medical History:  Diagnosis Date  . Fibroid   . Medical history non-contributory     Past Surgical History:  Procedure Laterality Date  . cyst removed Left 1983  . HYSTEROSCOPY WITH NOVASURE N/A 02/02/2014   Procedure: Dilatation and Curretage Hysteroscopy with resection of fibroid.HYSTEROSCOPY WITH attempted novasure. HYSTEROSCOPY WITH attempted  HYDROTHERMAL ABLATION;  Surgeon: Mora Bellman, MD;  Location: [facility];  Service: Gynecology;  Laterality: N/A;  . NO PAST SURGERIES     Family History:  Family History  Problem  Relation Age of Onset  . Alcohol abuse Neg Hx   . Arthritis Neg Hx   . Asthma Neg Hx   . Birth defects Neg Hx   . Cancer Neg Hx   . COPD Neg Hx   . Depression Neg Hx   . Diabetes Neg Hx   . Drug abuse Neg Hx   . Early death Neg Hx   . Hearing loss Neg Hx   . Heart disease Neg Hx   . Hyperlipidemia Neg Hx   . Hypertension Neg Hx   . Kidney disease Neg Hx   . Learning disabilities Neg Hx   . Mental illness Neg Hx   . Mental retardation Neg Hx   . Miscarriages / Stillbirths Neg Hx   . Stroke Neg Hx   . Vision loss Neg Hx   . Varicose Veins Neg Hx    Family Psychiatric  History: See H&P Social History:  History  Alcohol Use No     History  Drug Use No    Social History   Social History  . Marital status: Single    Spouse name: N/A  . Number of children: N/A  . Years of education: N/A   Social History Main Topics  . Smoking status: Current Every Day Smoker    Types: Cigarettes  . Smokeless tobacco: Never Used  . Alcohol use No  . Drug use: No  . Sexual activity: Not Currently   Other Topics Concern  . None   Social History Narrative  .  None   Additional Social History:    Pain Medications: See MAR Prescriptions: See MAR Over the Counter: See MAR History of alcohol / drug use?: Yes Longest period of sobriety (when/how long): unknown Negative Consequences of Use: Personal relationships, Financial Withdrawal Symptoms: Irritability, Agitation Name of Substance 1: Cocaine 1 - Age of First Use: 20s 1 - Amount (size/oz): pt reports "depends on what I can afford" 1 - Frequency: 2x/week 1 - Duration: ongoing 1 - Last Use / Amount: PTA Name of Substance 2: Alcohol 2 - Age of First Use: teens 2 - Amount (size/oz): 4-5 beers 2 - Frequency: pt reports "everyday if I could afford it" 2 - Duration: ongoing 2 - Last Use / Amount: 11/09/16                Sleep: Good  Appetite:  Good  Current Medications: Current Facility-Administered Medications   Medication Dose Route Frequency Provider Last Rate Last Dose  . acetaminophen (TYLENOL) tablet 650 mg  650 mg Oral Q6H PRN Lindon Romp A, NP      . alum & mag hydroxide-simeth (MAALOX/MYLANTA) 200-200-20 MG/5ML suspension 30 mL  30 mL Oral Q4H PRN Lindon Romp A, NP      . FLUoxetine (PROZAC) capsule 20 mg  20 mg Oral Daily Ambrose Finland, MD   20 mg at 11/12/16 0849  . hydrOXYzine (ATARAX/VISTARIL) tablet 25 mg  25 mg Oral Q6H PRN Lindon Romp A, NP   25 mg at 11/10/16 2340  . loperamide (IMODIUM) capsule 2-4 mg  2-4 mg Oral PRN Lindon Romp A, NP      . LORazepam (ATIVAN) tablet 1 mg  1 mg Oral Q6H PRN Lindon Romp A, NP   1 mg at 11/10/16 2340  . LORazepam (ATIVAN) tablet 1 mg  1 mg Oral TID Rozetta Nunnery, NP       Followed by  . [START ON 11/13/2016] LORazepam (ATIVAN) tablet 1 mg  1 mg Oral BID Rozetta Nunnery, NP       Followed by  . [START ON 11/15/2016] LORazepam (ATIVAN) tablet 1 mg  1 mg Oral Daily Lindon Romp A, NP      . magnesium hydroxide (MILK OF MAGNESIA) suspension 30 mL  30 mL Oral Daily PRN Lindon Romp A, NP      . multivitamin with minerals tablet 1 tablet  1 tablet Oral Daily Lindon Romp A, NP   1 tablet at 11/12/16 0849  . ondansetron (ZOFRAN-ODT) disintegrating tablet 4 mg  4 mg Oral Q6H PRN Lindon Romp A, NP      . thiamine (VITAMIN B-1) tablet 100 mg  100 mg Oral Daily Lindon Romp A, NP   100 mg at 11/12/16 0849  . traZODone (DESYREL) tablet 50 mg  50 mg Oral QHS,MR X 1 Lindon Romp A, NP   50 mg at 11/11/16 2128    Lab Results:  Results for orders placed or performed during the hospital encounter of 11/10/16 (from the past 48 hour(s))  Lipid panel     Status: None   Collection Time: 11/12/16  6:16 AM  Result Value Ref Range   Cholesterol 154 0 - 200 mg/dL   Triglycerides 81 <150 mg/dL   HDL 59 >40 mg/dL   Total CHOL/HDL Ratio 2.6 RATIO   VLDL 16 0 - 40 mg/dL   LDL Cholesterol 79 0 - 99 mg/dL    Comment:        Total Cholesterol/HDL:CHD  Risk Coronary Heart Disease  Risk Table                     Men   Women  1/2 Average Risk   3.4   3.3  Average Risk       5.0   4.4  2 X Average Risk   9.6   7.1  3 X Average Risk  23.4   11.0        Use the calculated Patient Ratio above and the CHD Risk Table to determine the patient's CHD Risk.        ATP III CLASSIFICATION (LDL):  <100     mg/dL   Optimal  100-129  mg/dL   Near or Above                    Optimal  130-159  mg/dL   Borderline  160-189  mg/dL   High  >190     mg/dL   Very High Performed at [facility] [address], [place], [place] [address]   TSH     Status: None   Collection Time: 11/12/16  6:16 AM  Result Value Ref Range   TSH 1.097 0.350 - 4.500 uIU/mL    Comment: Performed by a 3rd Generation assay with a functional sensitivity of <=0.01 uIU/mL. Performed at [facility], [place] [address] [place], [place] [address]     Blood Alcohol level:  Lab Results  Component Value Date   ETH <5 86/57/8469    Metabolic Disorder Labs: No results found for: HGBA1C, MPG No results found for: PROLACTIN Lab Results  Component Value Date   CHOL 154 11/12/2016   TRIG 81 11/12/2016   HDL 59 11/12/2016   CHOLHDL 2.6 11/12/2016   VLDL 16 11/12/2016   LDLCALC 79 11/12/2016    Physical Findings: AIMS: Facial and Oral Movements Muscles of Facial Expression: None, normal Lips and Perioral Area: None, normal Jaw: None, normal Tongue: None, normal,Extremity Movements Upper (arms, wrists, hands, fingers): None, normal Lower (legs, knees, ankles, toes): None, normal, Trunk Movements Neck, shoulders, hips: None, normal, Overall Severity Severity of abnormal movements (highest score from questions above): None, normal Incapacitation due to abnormal movements: None, normal Patient's awareness of abnormal movements (rate only patient's report): No Awareness, Dental Status Current problems with teeth and/or dentures?: No Does patient  usually wear dentures?: No  CIWA:  CIWA-Ar Total: 4 COWS:     Musculoskeletal: Strength & Muscle Tone: within normal limits Gait & Station: normal Patient leans: N/A  Psychiatric Specialty Exam: Physical Exam  Nursing note and vitals reviewed. Constitutional: She is oriented to person, place, and time. She appears well-developed.  Cardiovascular: Normal rate.   Musculoskeletal: Normal range of motion.  Neurological: She is alert and oriented to person, place, and time.  Skin: Skin is warm.    Review of Systems  Constitutional: Negative.   HENT: Negative.   Eyes: Negative.   Respiratory: Negative.   Cardiovascular: Negative.   Gastrointestinal: Negative.   Genitourinary: Negative.   Musculoskeletal: Negative.   Skin: Negative.   Neurological: Negative.   Endo/Heme/Allergies: Negative.     Blood pressure (!) 103/56, pulse 75, temperature 98.2 F (36.8 C), temperature source Oral, resp. rate 18, height 5\' 3"  (1.6 m), weight 108.9 kg (240 lb).Body mass index is 42.51 kg/m.  General Appearance: Disheveled  Eye Contact:  Fair  Speech:  Clear and Coherent  Volume:  Normal  Mood:  Euthymic  Affect:  Flat  Thought Process:  Coherent and Descriptions of Associations: Intact  Orientation:  Full (Time, Place, and Person)  Thought Content:  WDL  Suicidal Thoughts:  Yes.  without intent/plan  Homicidal Thoughts:  No  Memory:  Immediate;   Good  Judgement:  Good  Insight:  Fair  Psychomotor Activity:  Normal  Concentration:  Concentration: Good and Attention Span: Good  Recall:  Good  Fund of Knowledge:  Good  Language:  Good  Akathisia:  No  Handed:  Right  AIMS (if indicated):     Assets:  Financial Resources/Insurance  ADL's:  Intact  Cognition:  WNL  Sleep:  Number of Hours: 6.5     Treatment Plan Summary: Daily contact with patient to assess and evaluate symptoms and progress in treatment, Medication management and Plan is to:  -Discontinue Ativan  protocol -Start Librium protocol for CIWA -Continue Prozac 20 mg PO Daily -Continue Trazodone QHS PRN for insomnia -Continue Vistaril PRN for anxiety -Group therapy participation  Lewis Shock, FNP 11/12/2016, 2:00 PM

## 2016-11-12 NOTE — [facility] Suicide Risk Assessment (Signed)
[facility] INPATIENT:  Family/Significant Other Suicide Prevention Education  Suicide Prevention Education:  Patient Refusal for Family/Significant Other Suicide Prevention Education: The patient Tamara Short has refused to provide written consent for family/significant other to be provided Family/Significant Other Suicide Prevention Education during admission and/or prior to discharge.  Physician notified.  SPE completed with pt, as pt refused to consent to family contact. SPI pamphlet provided to pt and pt was encouraged to share information with support network, ask questions, and talk about any concerns relating to SPE. Pt denies access to guns/firearms and verbalized understanding of information provided. Mobile Crisis information also provided to pt.   Ralphie Lovelady N Smart LCSW 11/12/2016, 10:58 AM

## 2016-11-12 NOTE — Progress Notes (Signed)
Recreation Therapy Notes  Date: 11/12/2016 Time: 9:30am Location: [address] Dayroom  Group Topic: Stress Management  Goal Area(s) Addresses:  Patient will verbalize importance of using healthy stress management.  Patient will identify positive emotions associated with healthy stress management.   Intervention: Stress Management  Activity :  Guided Body Scan. Recreation Therapy Intern introduced the stress management technique of guided body scanning. Recreation Therapy Intern played a YouTube video that allowed patients to mentally scan their body for areas of tensions. Patients were to follow along as video was played to engage in the activity.  Education: Stress Management, Discharge Planning.   Education Outcome: Acknowledges edcuation  Clinical Observations/Feedback: Pt did not attend group.  Donovan Kail, Recreation Therapy Intern

## 2016-11-12 NOTE — Plan of Care (Signed)
Problem: Safety: Goal: Periods of time without injury will increase Outcome: Progressing Pt has not harmed self or others tonight.  She denies SI/HI and verbally contracts for safety.    

## 2016-11-12 NOTE — Tx Team (Signed)
Interdisciplinary Treatment and Diagnostic Plan Update  11/12/2016 Time of Session: [id number] Tamara Short MRN: [id number]  Principal Diagnosis: Major Depressive Disorder, recurrent, severe  Secondary Diagnoses: Active Problems:   Severe major depression ([facility])   Substance induced mood disorder ([facility])   Alcohol abuse   Cocaine abuse with cocaine-induced mood disorder ([facility])   Nicotine abuse   Current Medications:  Current Facility-Administered Medications  Medication Dose Route Frequency Provider Last Rate Last Dose  . acetaminophen (TYLENOL) tablet 650 mg  650 mg Oral Q6H PRN Lindon Romp A, NP      . alum & mag hydroxide-simeth (MAALOX/MYLANTA) 200-200-20 MG/5ML suspension 30 mL  30 mL Oral Q4H PRN Lindon Romp A, NP      . FLUoxetine (PROZAC) capsule 20 mg  20 mg Oral Daily Ambrose Finland, MD   20 mg at 11/12/16 0849  . hydrOXYzine (ATARAX/VISTARIL) tablet 25 mg  25 mg Oral Q6H PRN Lindon Romp A, NP   25 mg at 11/10/16 2340  . loperamide (IMODIUM) capsule 2-4 mg  2-4 mg Oral PRN Lindon Romp A, NP      . LORazepam (ATIVAN) tablet 1 mg  1 mg Oral Q6H PRN Lindon Romp A, NP   1 mg at 11/10/16 2340  . LORazepam (ATIVAN) tablet 1 mg  1 mg Oral QID Lindon Romp A, NP   1 mg at 11/12/16 0849   Followed by  . LORazepam (ATIVAN) tablet 1 mg  1 mg Oral TID Rozetta Nunnery, NP       Followed by  . [START ON 11/13/2016] LORazepam (ATIVAN) tablet 1 mg  1 mg Oral BID Rozetta Nunnery, NP       Followed by  . [START ON 11/15/2016] LORazepam (ATIVAN) tablet 1 mg  1 mg Oral Daily Lindon Romp A, NP      . magnesium hydroxide (MILK OF MAGNESIA) suspension 30 mL  30 mL Oral Daily PRN Lindon Romp A, NP      . multivitamin with minerals tablet 1 tablet  1 tablet Oral Daily Lindon Romp A, NP   1 tablet at 11/12/16 0849  . ondansetron (ZOFRAN-ODT) disintegrating tablet 4 mg  4 mg Oral Q6H PRN Lindon Romp A, NP      . thiamine (VITAMIN B-1) tablet 100 mg  100 mg Oral Daily Lindon Romp A, NP   100  mg at 11/12/16 0849  . traZODone (DESYREL) tablet 50 mg  50 mg Oral QHS,MR X 1 Lindon Romp A, NP   50 mg at 11/11/16 2128   PTA Medications: No prescriptions prior to admission.    Patient Stressors: Marital or family conflict Medication change or noncompliance Substance abuse  Patient Strengths: Technical sales engineer for treatment/growth  Treatment Modalities: Medication Management, Group therapy, Case management,  1 to 1 session with clinician, Psychoeducation, Recreational therapy.   Physician Treatment Plan for Primary Diagnosis: Major Depressive Disorder, recurrent, severe  Long Term Goal(s): Improvement in symptoms so as ready for discharge Improvement in symptoms so as ready for discharge   Short Term Goals: Ability to identify changes in lifestyle to reduce recurrence of condition will improve Ability to verbalize feelings will improve Compliance with prescribed medications will improve Ability to identify triggers associated with substance abuse/mental health issues will improve Ability to disclose and discuss suicidal ideas Ability to identify and develop effective coping behaviors will improve  Medication Management: Evaluate patient's response, side effects, and tolerance of medication regimen.  Therapeutic Interventions: 1 to 1  sessions, Unit Group sessions and Medication administration.  Evaluation of Outcomes: Progressing  Physician Treatment Plan for Secondary Diagnosis: Active Problems:   Severe major depression ([place])   Substance induced mood disorder ([facility])   Alcohol abuse   Cocaine abuse with cocaine-induced mood disorder ([facility])   Nicotine abuse  Long Term Goal(s): Improvement in symptoms so as ready for discharge Improvement in symptoms so as ready for discharge   Short Term Goals: Ability to identify changes in lifestyle to reduce recurrence of condition will improve Ability to verbalize feelings will improve Compliance with prescribed  medications will improve Ability to identify triggers associated with substance abuse/mental health issues will improve Ability to disclose and discuss suicidal ideas Ability to identify and develop effective coping behaviors will improve     Medication Management: Evaluate patient's response, side effects, and tolerance of medication regimen.  Therapeutic Interventions: 1 to 1 sessions, Unit Group sessions and Medication administration.  Evaluation of Outcomes: Progressing   RN Treatment Plan for Primary Diagnosis: Major Depressive Disorder, recurrent, severe  Long Term Goal(s): Knowledge of disease and therapeutic regimen to maintain health will improve  Short Term Goals: Ability to remain free from injury will improve, Ability to verbalize feelings will improve and Ability to disclose and discuss suicidal ideas  Medication Management: RN will administer medications as ordered by provider, will assess and evaluate patient's response and provide education to patient for prescribed medication. RN will report any adverse and/or side effects to prescribing provider.  Therapeutic Interventions: 1 on 1 counseling sessions, Psychoeducation, Medication administration, Evaluate responses to treatment, Monitor vital signs and CBGs as ordered, Perform/monitor CIWA, COWS, AIMS and Fall Risk screenings as ordered, Perform wound care treatments as ordered.  Evaluation of Outcomes: Progressing   LCSW Treatment Plan for Primary Diagnosis: Major Depressive Disorder, recurrent, severe  Long Term Goal(s): Safe transition to appropriate next level of care at discharge, Engage patient in therapeutic group addressing interpersonal concerns.  Short Term Goals: Engage patient in aftercare planning with referrals and resources, Facilitate patient progression through stages of change regarding substance use diagnoses and concerns and Identify triggers associated with mental health/substance abuse  issues  Therapeutic Interventions: Assess for all discharge needs, 1 to 1 time with Social worker, Explore available resources and support systems, Assess for adequacy in community support network, Educate family and significant other(s) on suicide prevention, Complete Psychosocial Assessment, Interpersonal group therapy.  Evaluation of Outcomes: Progressing   Progress in Treatment: Attending groups: Yes. Participating in groups: Yes. Taking medication as prescribed: Yes. Toleration medication: Yes. Family/Significant other contact made: SPE completed with pt; pt declined to consent to family contact.  Patient understands diagnosis: Yes. Discussing patient identified problems/goals with staff: Yes. Medical problems stabilized or resolved: Yes. Denies suicidal/homicidal ideation: Yes. Issues/concerns per patient self-inventory: No. Other: n/a   New problem(s) identified: No, Describe:  n/a  New Short Term/Long Term Goal(s): detox, medication management for mood stabilization, elimination of SI thoughts; development of comprehensive mental wellness/sobriety plan.   Discharge Plan or Barriers: CSW assessing for appropriate referrals. Pt lives with her adult children/stressful environment. Pt requests [facility] and [facility] referrals. [place] pamphlet and AA/NA lists provided to pt for additional community support/resources.   Reason for Continuation of Hospitalization: Anxiety Depression Medication stabilization Suicidal ideation Withdrawal symptoms  Estimated Length of Stay: Thursday 11/15/16  Attendees: Patient: 11/12/2016 10:53 AM  Physician: Dr. Parke Poisson MD; Dr. Dwyane Dee MD 11/12/2016 10:53 AM  Nursing: Benjamine Mola RN; Christa RN 11/12/2016 10:53 AM  RN Care Manager:  Lars Pinks [facility] 11/12/2016 10:53 AM  Social Worker: Maxie Better, LCSW 11/12/2016 10:53 AM  Recreational Therapist: Rhunette Croft 11/12/2016 10:53 AM  Other: Ricky Ala NP; Mordecai Maes NP; Darnelle Maffucci Money NP 11/12/2016 10:53 AM   Other:  11/12/2016 10:53 AM  Other: 11/12/2016 10:53 AM    Scribe for Treatment Team: [place], LCSW 11/12/2016 10:53 AM

## 2016-11-12 NOTE — Progress Notes (Signed)
Patient ID: Tamara Short, female   DOB: 1960-11-08, 56 y.o.   MRN: [id number]  DAR: Pt. Denies HI and A/V Hallucinations. She does endorse passive SI. She states that she can contract for safety with staff. She reports sleep is good, appetite is good, and her energy level is low. She rates her depression level 8/10, hopelessness level 5/10, and anxiety level 7/10. Patient does not report any pain or discomfort at this time. Support and encouragement provided to the patient however patient is minimal and forwards little to Probation officer. She is isolated to her room for most of the morning, she is seen laying in her bed frequently. Scheduled medications administered to patient per physician's order except for 1200 Ativan which writer did not administer to patient for safety. Patient's BP was decreased, she was lethargic and had overall malaise. NP Gaynelle Cage. Was notified that writer did not give 1200 Ativan. Patient's affect is flat and mood is depressed currently. Q15 minute checks are maintained for safety.

## 2016-11-12 NOTE — Progress Notes (Signed)
D: Pt presents with depressed affect and mood.  Pt is slow to respond to questions asked by Probation officer.  Denies having a goal for the day.  Denies HI/SI, hallucinations, and pain.  Attended evening group.  Few interactions with peers.  A: Introduced self to pt and offered support and encouragement.  Medication administered per order.  Q15 minute safety checks maintained.  R: Pt is compliant with medications.  She verbally contracts for safety.  Will continue to monitor and assess.

## 2016-11-13 DIAGNOSIS — F1994 Other psychoactive substance use, unspecified with psychoactive substance-induced mood disorder: Secondary | ICD-10-CM

## 2016-11-13 DIAGNOSIS — F1010 Alcohol abuse, uncomplicated: Secondary | ICD-10-CM

## 2016-11-13 DIAGNOSIS — F322 Major depressive disorder, single episode, severe without psychotic features: Principal | ICD-10-CM

## 2016-11-13 LAB — HEMOGLOBIN A1C
HEMOGLOBIN A1C: 5.2 % (ref 4.8–5.6)
MEAN PLASMA GLUCOSE: 103 mg/dL

## 2016-11-13 NOTE — Progress Notes (Addendum)
Patient ID: Tamara Short, female   DOB: 1961-01-17, 56 y.o.   MRN: [id number]  Pt currently presents with a flat affect and anxious behavior. Pt reports to writer that their goal is to "stop being so angry and irritated with everyone." Pt is tearful when talking about her conflict with her family and fluctuating mood. Pt reports good sleep with current medication regimen however she also reports having an intense nightmare last night. Presents with signs and symptoms of withdrawal including fatigue, agitation/anxiety and tremors.   Pt provided with medications per providers orders. Pt's labs and vitals were monitored throughout the night. Pt given a 1:1 about emotional and mental status. Pt supported and encouraged to express concerns and questions. Pt educated on medications and physiology of depression.   Pt's safety ensured with 15 minute and environmental checks.  Pt currently denies SI and A/V hallucinations. Pt endorses passive HI towards a patient on the unit with her. Pt verbally agrees to seek staff if SI or A/VH occurs and to consult with staff before acting on any harmful thoughts. Will continue POC.

## 2016-11-13 NOTE — Progress Notes (Signed)
Pt observed walking around the unit with a blanket wrapped around her.  She had a visit with her daughter during visitation.  She presents flat and sullen who forwards little in conversation with Probation officer.  She denies any withdrawal symptoms.  She says she is still having some passive suicidal thoughts, but denies HI/AVH.  She voiced some increased anxiety at the beginning of the shift and was given Vistaril 25 mg at that time.  Support and encouragement offered.  Discharge plans are in process.  Pt wants long term care after [facility].  Safety maintained with q15 minute checks.

## 2016-11-13 NOTE — Progress Notes (Signed)
[facility] MD Progress Note  3/87/5643 3:29 PM Tamara Short  MRN:  [id number] Subjective:   56 yo  AAF, single lives with her sons, employed at [employer]. 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 Presented to the ER in own transport. She was accompanied by her daughter. She reported struggling with alcohol and cocaine. Reported transient hallucinations while under the influence. She was seeking help to recover. Hopes to get into long term rehab. UDS was positive for cocaine. Other parameters were normal.   Chart reviewed today. Patient discussed at team today.   Staff reports that she continues to report suicidal thoughts. She was observed to be withdrawn. She expressed being anxious and has required PRN Vistaril.   Seen today. Says she has been dealing with financial difficulties. Her house would be foreclosed soon. States that she has been using drugs over the years. This has affected the choices she has made. Says she needs to get into rehab as she is worried she would relapse if she is not in a sober environment. Says she does not get enough hours at [employer]. Last time she worked was Wednesday. Patient says she ruminates a lot about the poor choices she has made over the years. Says when she reflects, her conscience criticizes her. She has had thoughts of giving up. No intent to harm self. Her children are motivating factor to get better. Patient has agreed to actively engage in searching for rehab. Normal appetite and sleep. Normal energy levels. No hallucinations since she has been here. No violent thoughts towards others. Says last night was the first time she has not had dreams about using.   Principal Problem: Severe major depression ([place]) Diagnosis:  SUD Patient Active Problem List   Diagnosis Date Noted  . Substance induced mood disorder ([place]) [F19.94]   . Alcohol abuse [F10.10]   . Cocaine abuse with cocaine-induced mood disorder ([place]) [F14.14]   . Nicotine abuse [Z72.0]    . Severe major depression ([place]) [F32.2] 11/10/2016  . Abnormal uterine bleeding [N93.9] 09/30/2013  . Menorrhagia [N92.0] 10/31/2012  . Anemia [D64.9] 10/31/2012  . OBESITY, NOS [E66.9] 06/13/2006   Total Time spent with patient: 25 minutes  Past Psychiatric History: As in H&P   Past Medical History:  Past Medical History:  Diagnosis Date  . Fibroid   . Medical history non-contributory     Past Surgical History:  Procedure Laterality Date  . cyst removed Left 1983  . HYSTEROSCOPY WITH NOVASURE N/A 02/02/2014   Procedure: Dilatation and Curretage Hysteroscopy with resection of fibroid.HYSTEROSCOPY WITH attempted novasure. HYSTEROSCOPY WITH attempted  HYDROTHERMAL ABLATION;  Surgeon: Mora Bellman, MD;  Location: [facility];  Service: Gynecology;  Laterality: N/A;  . NO PAST SURGERIES     Family History:  Family History  Problem Relation Age of Onset  . Alcohol abuse Neg Hx   . Arthritis Neg Hx   . Asthma Neg Hx   . Birth defects Neg Hx   . Cancer Neg Hx   . COPD Neg Hx   . Depression Neg Hx   . Diabetes Neg Hx   . Drug abuse Neg Hx   . Early death Neg Hx   . Hearing loss Neg Hx   . Heart disease Neg Hx   . Hyperlipidemia Neg Hx   . Hypertension Neg Hx   . Kidney disease Neg Hx   . Learning disabilities Neg Hx   . Mental illness Neg Hx   . Mental retardation Neg  Hx   . Miscarriages / Stillbirths Neg Hx   . Stroke Neg Hx   . Vision loss Neg Hx   . Varicose Veins Neg Hx    Family Psychiatric  History: As in H&P Social History:  History  Alcohol Use No     History  Drug Use No    Social History   Social History  . Marital status: Single    Spouse name: N/A  . Number of children: N/A  . Years of education: N/A   Social History Main Topics  . Smoking status: Current Every Day Smoker    Types: Cigarettes  . Smokeless tobacco: Never Used  . Alcohol use No  . Drug use: No  . Sexual activity: Not Currently   Other Topics Concern  . None   Social  History Narrative  . None   Additional Social History:    Pain Medications: See MAR Prescriptions: See MAR Over the Counter: See MAR History of alcohol / drug use?: Yes Longest period of sobriety (when/how long): unknown Negative Consequences of Use: Personal relationships, Financial Withdrawal Symptoms: Irritability, Agitation Name of Substance 1: Cocaine 1 - Age of First Use: 20s 1 - Amount (size/oz): pt reports "depends on what I can afford" 1 - Frequency: 2x/week 1 - Duration: ongoing 1 - Last Use / Amount: PTA Name of Substance 2: Alcohol 2 - Age of First Use: teens 2 - Amount (size/oz): 4-5 beers 2 - Frequency: pt reports "everyday if I could afford it" 2 - Duration: ongoing 2 - Last Use / Amount: 11/09/16                Sleep: Good  Appetite:  Good  Current Medications: Current Facility-Administered Medications  Medication Dose Route Frequency Provider Last Rate Last Dose  . acetaminophen (TYLENOL) tablet 650 mg  650 mg Oral Q6H PRN Lindon Romp A, NP      . alum & mag hydroxide-simeth (MAALOX/MYLANTA) 200-200-20 MG/5ML suspension 30 mL  30 mL Oral Q4H PRN Lindon Romp A, NP      . chlordiazePOXIDE (LIBRIUM) capsule 25 mg  25 mg Oral Q6H PRN Money, Lowry Ram, FNP      . FLUoxetine (PROZAC) capsule 20 mg  20 mg Oral Daily Ambrose Finland, MD   20 mg at 11/13/16 0945  . hydrOXYzine (ATARAX/VISTARIL) tablet 25 mg  25 mg Oral Q6H PRN Money, Lowry Ram, FNP   25 mg at 11/12/16 2016  . loperamide (IMODIUM) capsule 2-4 mg  2-4 mg Oral PRN Money, Lowry Ram, FNP      . magnesium hydroxide (MILK OF MAGNESIA) suspension 30 mL  30 mL Oral Daily PRN Lindon Romp A, NP      . multivitamin with minerals tablet 1 tablet  1 tablet Oral Daily Money, Lowry Ram, FNP   1 tablet at 11/13/16 0945  . ondansetron (ZOFRAN-ODT) disintegrating tablet 4 mg  4 mg Oral Q6H PRN Money, Lowry Ram, FNP      . thiamine (VITAMIN B-1) tablet 100 mg  100 mg Oral Daily Money, Travis B, FNP   100 mg  at 11/13/16 0945  . traZODone (DESYREL) tablet 50 mg  50 mg Oral QHS,MR X 1 Lindon Romp A, NP   50 mg at 11/12/16 2203    Lab Results:  Results for orders placed or performed during the hospital encounter of 11/10/16 (from the past 48 hour(s))  Hemoglobin A1c     Status: None   Collection Time: 11/12/16  6:16  AM  Result Value Ref Range   Hgb A1c MFr Bld 5.2 4.8 - 5.6 %    Comment: (NOTE)         Pre-diabetes: 5.7 - 6.4         Diabetes: >6.4         Glycemic control for adults with diabetes: <7.0    Mean Plasma Glucose 103 mg/dL    Comment: (NOTE) Performed At: [facility] [place], [place] [id number] Lindon Romp MD [id number] Performed at [facility], [place] [address], [place], [place] [address]   Lipid panel     Status: None   Collection Time: 11/12/16  6:16 AM  Result Value Ref Range   Cholesterol 154 0 - 200 mg/dL   Triglycerides 81 <150 mg/dL   HDL 59 >40 mg/dL   Total CHOL/HDL Ratio 2.6 RATIO   VLDL 16 0 - 40 mg/dL   LDL Cholesterol 79 0 - 99 mg/dL    Comment:        Total Cholesterol/HDL:CHD Risk Coronary Heart Disease Risk Table                     Men   Women  1/2 Average Risk   3.4   3.3  Average Risk       5.0   4.4  2 X Average Risk   9.6   7.1  3 X Average Risk  23.4   11.0        Use the calculated Patient Ratio above and the CHD Risk Table to determine the patient's CHD Risk.        ATP III CLASSIFICATION (LDL):  <100     mg/dL   Optimal  100-129  mg/dL   Near or Above                    Optimal  130-159  mg/dL   Borderline  160-189  mg/dL   High  >190     mg/dL   Very High Performed at [facility] [address] [place], [place] [address]   TSH     Status: None   Collection Time: 11/12/16  6:16 AM  Result Value Ref Range   TSH 1.097 0.350 - 4.500 uIU/mL    Comment: Performed by a 3rd Generation assay with a functional sensitivity of <=0.01 uIU/mL. Performed at [facility], [place] [address] [place], [place] [address]     Blood Alcohol level:  Lab Results  Component Value Date   ETH <5 97/35/3299    Metabolic Disorder Labs: Lab Results  Component Value Date   HGBA1C 5.2 11/12/2016   MPG 103 11/12/2016   No results found for: PROLACTIN Lab Results  Component Value Date   CHOL 154 11/12/2016   TRIG 81 11/12/2016   HDL 59 11/12/2016   CHOLHDL 2.6 11/12/2016   VLDL 16 11/12/2016   LDLCALC 79 11/12/2016    Physical Findings: AIMS: Facial and Oral Movements Muscles of Facial Expression: None, normal Lips and Perioral Area: None, normal Jaw: None, normal Tongue: None, normal,Extremity Movements Upper (arms, wrists, hands, fingers): None, normal Lower (legs, knees, ankles, toes): None, normal, Trunk Movements Neck, shoulders, hips: None, normal, Overall Severity Severity of abnormal movements (highest score from questions above): None, normal Incapacitation due to abnormal movements: None, normal Patient's awareness of abnormal movements (rate only patient's report): No Awareness, Dental Status Current problems with teeth and/or  dentures?: No Does patient usually wear dentures?: No  CIWA:  CIWA-Ar Total: 0 COWS:     Musculoskeletal: Strength & Muscle Tone: within normal limits Gait & Station: normal Patient leans: N/A  Psychiatric Specialty Exam: Physical Exam  Constitutional: She is oriented to person, place, and time. No distress.  HENT:  Head: Normocephalic and atraumatic.  Eyes: Pupils are equal, round, and reactive to light.  Neck: Normal range of motion. Neck supple.  Respiratory: Effort normal.  Neurological: She is alert and oriented to person, place, and time.  Skin: She is not diaphoretic.  Psychiatric:  As above    ROS  Blood pressure 112/75, pulse 79, temperature 98.6 F (37 C), temperature source Oral, resp. rate 16, height 5\' 3"  (1.6 m), weight 108.9 kg (240 lb).Body mass index is 42.51 kg/m.  General  Appearance: Casually dressed, was at the day room just before interview, calm and cooperative. A bit withdrawn. Not internally distracted.   Eye Contact:  Good  Speech:  Clear and Coherent and Normal Rate  Volume:  Soft spoken  Mood:  Worried   Affect:  Congruent and Restricted  Thought Process:  Linear  Orientation:  Full (Time, Place, and Person)  Thought Content:  Rumination about the past and the future. No hallucination in any modality. No thoughts of violence.   Suicidal Thoughts:  No  Homicidal Thoughts:  No  Memory:  Immediate;   Good Recent;   Good Remote;   Good  Judgement:  Good  Insight:  Good  Psychomotor Activity:  Decreased  Concentration:  Concentration: Good and Attention Span: Good  Recall:  Good  Fund of Knowledge:  Good  Language:  Good  Akathisia:  NA  Handed:    AIMS (if indicated):     Assets:  Communication Skills Desire for Improvement Housing Physical Health  ADL's:  Intact  Cognition:  WNL  Sleep:  Number of Hours: 6.25     Treatment Plan Summary: Patient is coming off the effects of substances. She is ruminating on the negative impact it has had on her life. Patient is motivated to stay sober. She wants to get into long term rehab. She is not pervasively depressed. She is not a danger to herself or others.   Psychiatric: SUD SIMD MDD   Medical:  Psychosocial:  Financial difficulties  PLAN: 1. Continue medications at current dose 2. Encourage unit groups and activities 3. Monitor mood, behavior and interaction with peers 4. Would actively seeking rehab 5. SW would facilitate aftercare 6. Hopeful discharge by Thursday.    [place], MD 11/13/2016, 4:37 PM

## 2016-11-13 NOTE — Progress Notes (Signed)
Patient denies SI, HI, and AVH.  Patient reports a low mood and low self opinion due to substance abuse.  Patient is motivated to continue to maintain sobriety outside of the hospital and is currently looking for resources for clean living.   Assess patient for safety, offer medications as prescribed, engage patient in 1:1 staff talks.   Continue to monitor as prescribed. Patient able to contract for safety.

## 2016-11-14 ENCOUNTER — Encounter (HOSPITAL_COMMUNITY): Payer: Self-pay | Admitting: Behavioral Health

## 2016-11-14 DIAGNOSIS — F1414 Cocaine abuse with cocaine-induced mood disorder: Secondary | ICD-10-CM

## 2016-11-14 DIAGNOSIS — F17210 Nicotine dependence, cigarettes, uncomplicated: Secondary | ICD-10-CM

## 2016-11-14 MED ORDER — HYDROXYZINE HCL 25 MG PO TABS: 25.0000 mg | ORAL_TABLET | Freq: Four times a day (QID) | ORAL | 0 refills | Status: DC | PRN

## 2016-11-14 MED ORDER — FLUOXETINE HCL 20 MG PO CAPS: 20.0000 mg | ORAL_CAPSULE | Freq: Every day | ORAL | 0 refills | Status: DC

## 2016-11-14 MED ORDER — TRAZODONE HCL 50 MG PO TABS: 50.0000 mg | ORAL_TABLET | Freq: Every evening | ORAL | 0 refills | Status: DC | PRN

## 2016-11-14 NOTE — Progress Notes (Signed)
D: Patient observed to be isolative much of shift. Sleeps/rests in bed. Frequent contacts made 1:1 throughout shift. Patient verbalizes to this Probation officer she is discharging to [facility] in the AM. Patient's affect flat, mood depressed. Per self inventory and discussions with writer, rates depression at an 8/10, hopelessness at a 10/10 and anxiety at a 7/10. Rates sleep as fair, appetite as fair, energy as normal and concentration as good.  States goal for today is to "call my [place], call my daughter and son." Denies pain, physical problems.   A: Medicated per orders, no prns required or requested. Level III obs in place for safety. Emotional support offered and self inventory reviewed. Encouraged completion of Suicide Safety Plan and programming participation. Discussed POC with MD, SW.     R: Patient verbalizes understanding of POC. Patient denies SI/HI/AVH and remains safe on level III obs. Will continue to monitor closely and make verbal contact frequently. Patient to discharge in the AM. All materials ready, awaiting SRA and MD aware.

## 2016-11-14 NOTE — Progress Notes (Signed)
Nursing Progress Note: 7p-7a D: Pt currently presents with a anxious affect and behavior. Pt states "I am ready for discharge." Interacting appropriately with milieu. Pt reports good sleep during the previous night with current medication regimen.   A: Pt provided with medications per providers orders. Pt's labs and vitals were monitored throughout the night. Pt supported emotionally and encouraged to express concerns and questions. Pt educated on medications.  R: Pt's safety ensured with 15 minute and environmental checks. Pt currently denies SI, HI, and AVH. Pt verbally contracts to seek staff if SI,HI, or AVH occurs and to consult with staff before acting on any harmful thoughts. Will continue to monitor.

## 2016-11-14 NOTE — Discharge Summary (Signed)
Physician Discharge Summary Note  Patient:  Tamara Short is an 56 y.o., female MRN:  [id number] DOB:  [birth year] Patient phone:  [phone] (home)  Patient address:   [address] [place] [address],  Total Time spent with patient: 30 minutes  Date of Admission:  11/10/2016 Date of Discharge: 11/15/2016  Reason for Admission:  Depression, SI, substance abuse   Principal Problem: Severe major depression [facility]) Discharge Diagnoses: Patient Active Problem List   Diagnosis Date Noted  . Substance induced mood disorder ([place]) [F19.94]   . Alcohol abuse [F10.10]   . Cocaine abuse with cocaine-induced mood disorder ([place]) [F14.14]   . Nicotine abuse [Z72.0]   . Severe major depression ([place]) [F32.2] 11/10/2016  . Abnormal uterine bleeding [N93.9] 09/30/2013  . Menorrhagia [N92.0] 10/31/2012  . Anemia [D64.9] 10/31/2012  . OBESITY, NOS [E66.9] 06/13/2006    Past Psychiatric History: MDD. No previous psychiatric admission or outpatient psychiatric care..   Past Medical History:  Past Medical History:  Diagnosis Date  . Fibroid   . Medical history non-contributory     Past Surgical History:  Procedure Laterality Date  . cyst removed Left 1983  . HYSTEROSCOPY WITH NOVASURE N/A 02/02/2014   Procedure: Dilatation and Curretage Hysteroscopy with resection of fibroid.HYSTEROSCOPY WITH attempted novasure. HYSTEROSCOPY WITH attempted  HYDROTHERMAL ABLATION;  Surgeon: Mora Bellman, MD;  Location: [facility];  Service: Gynecology;  Laterality: N/A;  . NO PAST SURGERIES     Family History:  Family History  Problem Relation Age of Onset  . Alcohol abuse Neg Hx   . Arthritis Neg Hx   . Asthma Neg Hx   . Birth defects Neg Hx   . Cancer Neg Hx   . COPD Neg Hx   . Depression Neg Hx   . Diabetes Neg Hx   . Drug abuse Neg Hx   . Early death Neg Hx   . Hearing loss Neg Hx   . Heart disease Neg Hx   . Hyperlipidemia Neg Hx   . Hypertension Neg Hx   . Kidney disease Neg Hx   .  Learning disabilities Neg Hx   . Mental illness Neg Hx   . Mental retardation Neg Hx   . Miscarriages / Stillbirths Neg Hx   . Stroke Neg Hx   . Vision loss Neg Hx   . Varicose Veins Neg Hx    Family Psychiatric  History: None per patient report  Social History:  History  Alcohol Use No     History  Drug Use No    Social History   Social History  . Marital status: Single    Spouse name: N/A  . Number of children: N/A  . Years of education: N/A   Social History Main Topics  . Smoking status: Current Every Day Smoker    Types: Cigarettes  . Smokeless tobacco: Never Used  . Alcohol use No  . Drug use: No  . Sexual activity: Not Currently   Other Topics Concern  . None   Social History Narrative  . None    Hospital Course: 56 year old female admitted to [facility] endorsing active suicidal ideation. Patient in and out during this admission assessment so she presents as a poor historian. She does acknowledged that prior to her admission to [facility] she was having suicidal thoughts with no plan or intent. Per admission note patient in regards to a plan, patient stated, "I was just going to leave.I just didn't want to be alive  anymore. " Patient endorses a history of cocaine and alcohol abuse. She endorses that she has been self-medicating with  these substances to better deal with her depression. Her UDS on admission  was positive for cocaine. Her ethanol was <5. She endorses that she has struggled with depression for the past 3-4 months however she does not provided details to what could be triggering her thoughts or depressive symptoms.  Patient  denies any history of SA, AVH or homicidal ideas. She denies any history of self-injurious behaviors. Patient does not provide any history of psychiatric medication use. She denies a family history of mental health illness. Denies previous inpatient hospitalizations or current outpatient psychiatric outpatient care.    After the above  admission assessment and during this hospital course, patients presenting symptoms were identified. Labs were reviewed and her UDS was (+) for cocaine. BAL<5. . 379.  Detoxification treatments administered as approproiate. Patient was treated and discharged with the following medications; Prozac 20 mg po daily for depression, Vistaril 25 mg po q6hrs as needed for anxiety,Trazadone 50 mg po daily as needed for insomnia. Patient tolerated her treatment regimen without any adverse effects reported. She remained compliant with therapeutic milieu and actively participated in group counseling sessions. AA/NA meetings were offered & held on the unit with patient active participation. While on the unit, patient was able to verbalize learned coping skills for better management of depression and suicidal thoughts and to better maintain these thoughts and symptoms when returning home.  During the course of her hospitalization, patient inititally presented as very depressed and withdrawn. Improvement of patients condition was monitored by observation and patients daily report of symptom reduction, presentation of good affect, and overall improvement in mood & behavior.Upon discharge, Tamara Short denied any SI/HI, AVH, delusional thoughts, or paranoia. She endorsed overall improvement in anxiety and depression. She endorsed improvement in withdrawal symptoms.  Prior to discharge, Tamara Short's case was presented during treatment team meeting this morning. The team members were all in agreement that she was both mentally & medically stable to be discharged to continue mental health care on an outpatient basis as noted below. She was provided with all the necessary information needed to make this appointment without problems.She was provided with prescriptions  of her [facility] discharge medications to be taken to her pharmacy as well a supply of samples. She left [facility] with all personal belongings in no apparent distress. Transportation per  patients arrangement.  Physical Findings: AIMS: Facial and Oral Movements Muscles of Facial Expression: None, normal Lips and Perioral Area: None, normal Jaw: None, normal Tongue: None, normal,Extremity Movements Upper (arms, wrists, hands, fingers): None, normal Lower (legs, knees, ankles, toes): None, normal, Trunk Movements Neck, shoulders, hips: None, normal, Overall Severity Severity of abnormal movements (highest score from questions above): None, normal Incapacitation due to abnormal movements: None, normal Patient's awareness of abnormal movements (rate only patient's report): No Awareness, Dental Status Current problems with teeth and/or dentures?: No Does patient usually wear dentures?: No  CIWA:  CIWA-Ar Total: 0 COWS:     Musculoskeletal: Strength & Muscle Tone: within normal limits Gait & Station: normal Patient leans: N/A  Psychiatric Specialty Exam: SEE SRA BY MD  Physical Exam  Nursing note and vitals reviewed. Constitutional: She is oriented to person, place, and time.  Neurological: She is alert and oriented to person, place, and time.    Review of Systems  Psychiatric/Behavioral: Positive for substance abuse (Hx of substance abuse ). Negative for depression (improved), hallucinations, memory loss  and suicidal ideas. Nervous/anxious: improved. Insomnia: improved.   All other systems reviewed and are negative.   Blood pressure 127/76, pulse 71, temperature 98.4 F (36.9 C), temperature source Oral, resp. rate 20, height 5\' 3"  (1.6 m), weight 240 lb (108.9 kg).Body mass index is 42.51 kg/m.    Have you used any form of tobacco in the last 30 days? (Cigarettes, Smokeless Tobacco, Cigars, and/or Pipes): Yes  Has this patient used any form of tobacco in the last 30 days? (Cigarettes, Smokeless Tobacco, Cigars, and/or Pipes) No  Blood Alcohol level:  Lab Results  Component Value Date   ETH <5 56/43/3295    Metabolic Disorder Labs:  Lab Results  Component  Value Date   HGBA1C 5.2 11/12/2016   MPG 103 11/12/2016   No results found for: PROLACTIN Lab Results  Component Value Date   CHOL 154 11/12/2016   TRIG 81 11/12/2016   HDL 59 11/12/2016   CHOLHDL 2.6 11/12/2016   VLDL 16 11/12/2016   LDLCALC 79 11/12/2016    See Psychiatric Specialty Exam and Suicide Risk Assessment completed by Attending Physician prior to discharge.  Discharge destination:  [facility]  Is patient on multiple antipsychotic therapies at discharge:  No   Has Patient had three or more failed trials of antipsychotic monotherapy by history:  No  Recommended Plan for Multiple Antipsychotic Therapies: NA   Allergies as of 11/15/2016   No Known Allergies     Medication List    TAKE these medications     Indication  FLUoxetine 20 MG capsule Commonly known as:  PROZAC Take 1 capsule (20 mg total) by mouth daily.  Indication:  Major Depressive Disorder   hydrOXYzine 25 MG tablet Commonly known as:  ATARAX/VISTARIL Take 1 tablet (25 mg total) by mouth every 6 (six) hours as needed for anxiety (or CIWA score </= 10).  Indication:  Feeling Anxious   traZODone 50 MG tablet Commonly known as:  DESYREL Take 1 tablet (50 mg total) by mouth at bedtime and may repeat dose one time if needed.  Indication:  Trouble Sleeping      Follow-up Information    Services, [facility] Follow up on 11/15/2016.   Why:  Screening for possible admission on Thursday 8/2 at 7:45AM. Please bring: photo ID/proof of [employer], clothing, and medication/prescriptions provided by hospital. Thank you.  Contact information: [place] [address] [phone]        [facility] up.   Specialty:  Behavioral Health Why:  Walk in within 7 days of hospital/rehab discharge to be assessed for outpatient mental health services. Walk in hours: Monday through Friday 8am-9am. Thank you.  Contact information: [place]  [phone]           Follow-up recommendations: Follow up with your outpatient provided for any medical issues. Activity & diet as recommended by your primary care provider.  Comments:  Patient is instructed prior to discharge to: Take all medications as prescribed by his/her mental healthcare provider. Report any adverse effects and or reactions from the medicines to his/her outpatient provider promptly. Patient has been instructed & cautioned: To not engage in alcohol and or illegal drug use while on prescription medicines. In the event of worsening symptoms, patient is instructed to call the crisis hotline, 911 and or go to the nearest ED for appropriate evaluation and treatment of symptoms. To follow-up with his/her primary care provider for your other medical issues,  concerns and or health care needs.  Signed: Mordecai Maes, NP 11/15/2016, 8:29 AM

## 2016-11-14 NOTE — Progress Notes (Signed)
[facility] MD Progress Note  12/17/4266 34:19 AM Tamara Short  MRN:  [id number]  Subjective: " Things are a little better,"    Evaluation on unit: Chart reviewed today. Patient discussed during treatment team and face to face evaluation completed. Tamara Short is a 56 yo  AAF, single lives with her sons, employed at [employer]. 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  During this evaluation, patient is alert and oriented x4, calm and cooperative. She endorses some improvement in depression and anxiety. Denies active or passive SI at this time although she does endorses that the thoughts are intermittent. She denies SI with p;an or intent. She is able to maintain on contract for safety on the unit. She continues to endorse mild withdrawal symptoms with some improvement. Discussed discharge disposition and patient is aware that her projected discharge date is tomorrow 11/15/2016.[place] disposition is [facility] and patient is receptive to this discharge plan.  Patient denies HI, AVH and does not appear to be internally preoccupied. She continues to endorse excessive worry regarding financial difficulties and the foreclosure of her home. Endorses both appetite and sleeping pattern as fair.She is tolerating medications well and denies current side effects. Su[pport and encouragement provided. Patient scheduled for discharge as noted above.     Principal Problem: Severe major depression ([place]) Diagnosis:  SUD Patient Active Problem List   Diagnosis Date Noted  . Substance induced mood disorder ([place]) [F19.94]   . Alcohol abuse [F10.10]   . Cocaine abuse with cocaine-induced mood disorder ([place]) [F14.14]   . Nicotine abuse [Z72.0]   . Severe major depression ([place]) [F32.2] 11/10/2016  . Abnormal uterine bleeding [N93.9] 09/30/2013  . Menorrhagia [N92.0] 10/31/2012  . Anemia [D64.9] 10/31/2012  . OBESITY, NOS [E66.9] 06/13/2006   Total Time spent with patient: 25 minutes  Past  Psychiatric History: As in H&P   Past Medical History:  Past Medical History:  Diagnosis Date  . Fibroid   . Medical history non-contributory     Past Surgical History:  Procedure Laterality Date  . cyst removed Left 1983  . HYSTEROSCOPY WITH NOVASURE N/A 02/02/2014   Procedure: Dilatation and Curretage Hysteroscopy with resection of fibroid.HYSTEROSCOPY WITH attempted novasure. HYSTEROSCOPY WITH attempted  HYDROTHERMAL ABLATION;  Surgeon: Mora Bellman, MD;  Location: [facility];  Service: Gynecology;  Laterality: N/A;  . NO PAST SURGERIES     Family History:  Family History  Problem Relation Age of Onset  . Alcohol abuse Neg Hx   . Arthritis Neg Hx   . Asthma Neg Hx   . Birth defects Neg Hx   . Cancer Neg Hx   . COPD Neg Hx   . Depression Neg Hx   . Diabetes Neg Hx   . Drug abuse Neg Hx   . Early death Neg Hx   . Hearing loss Neg Hx   . Heart disease Neg Hx   . Hyperlipidemia Neg Hx   . Hypertension Neg Hx   . Kidney disease Neg Hx   . Learning disabilities Neg Hx   . Mental illness Neg Hx   . Mental retardation Neg Hx   . Miscarriages / Stillbirths Neg Hx   . Stroke Neg Hx   . Vision loss Neg Hx   . Varicose Veins Neg Hx    Family Psychiatric  History: As in H&P Social History:  History  Alcohol Use No     History  Drug Use No    Social History   Social History  .  Marital status: Single    Spouse name: N/A  . Number of children: N/A  . Years of education: N/A   Social History Main Topics  . Smoking status: Current Every Day Smoker    Types: Cigarettes  . Smokeless tobacco: Never Used  . Alcohol use No  . Drug use: No  . Sexual activity: Not Currently   Other Topics Concern  . None   Social History Narrative  . None   Additional Social History:    Pain Medications: See MAR Prescriptions: See MAR Over the Counter: See MAR History of alcohol / drug use?: Yes Longest period of sobriety (when/how long): unknown Negative Consequences of Use:  Personal relationships, Financial Withdrawal Symptoms: Irritability, Agitation Name of Substance 1: Cocaine 1 - Age of First Use: 20s 1 - Amount (size/oz): pt reports "depends on what I can afford" 1 - Frequency: 2x/week 1 - Duration: ongoing 1 - Last Use / Amount: PTA Name of Substance 2: Alcohol 2 - Age of First Use: teens 2 - Amount (size/oz): 4-5 beers 2 - Frequency: pt reports "everyday if I could afford it" 2 - Duration: ongoing 2 - Last Use / Amount: 11/09/16                Sleep: Fair  Appetite:  Fair  Current Medications: Current Facility-Administered Medications  Medication Dose Route Frequency Provider Last Rate Last Dose  . acetaminophen (TYLENOL) tablet 650 mg  650 mg Oral Q6H PRN Lindon Romp A, NP      . alum & mag hydroxide-simeth (MAALOX/MYLANTA) 200-200-20 MG/5ML suspension 30 mL  30 mL Oral Q4H PRN Lindon Romp A, NP      . chlordiazePOXIDE (LIBRIUM) capsule 25 mg  25 mg Oral Q6H PRN Money, Lowry Ram, FNP   25 mg at 11/13/16 2148  . FLUoxetine (PROZAC) capsule 20 mg  20 mg Oral Daily Ambrose Finland, MD   20 mg at 11/14/16 0837  . hydrOXYzine (ATARAX/VISTARIL) tablet 25 mg  25 mg Oral Q6H PRN Money, Lowry Ram, FNP   25 mg at 11/12/16 2016  . loperamide (IMODIUM) capsule 2-4 mg  2-4 mg Oral PRN Money, Lowry Ram, FNP      . magnesium hydroxide (MILK OF MAGNESIA) suspension 30 mL  30 mL Oral Daily PRN Lindon Romp A, NP      . multivitamin with minerals tablet 1 tablet  1 tablet Oral Daily Money, Lowry Ram, FNP   1 tablet at 11/14/16 [phone]  . ondansetron (ZOFRAN-ODT) disintegrating tablet 4 mg  4 mg Oral Q6H PRN Money, Lowry Ram, FNP      . thiamine (VITAMIN B-1) tablet 100 mg  100 mg Oral Daily Money, Lowry Ram, FNP   100 mg at 11/14/16 5638  . traZODone (DESYREL) tablet 50 mg  50 mg Oral QHS,MR X 1 Lindon Romp A, NP   50 mg at 11/13/16 2148    Lab Results:  No results found for this or any previous visit (from the past 48 hour(s)).  Blood Alcohol  level:  Lab Results  Component Value Date   ETH <5 93/73/4287    Metabolic Disorder Labs: Lab Results  Component Value Date   HGBA1C 5.2 11/12/2016   MPG 103 11/12/2016   No results found for: PROLACTIN Lab Results  Component Value Date   CHOL 154 11/12/2016   TRIG 81 11/12/2016   HDL 59 11/12/2016   CHOLHDL 2.6 11/12/2016   VLDL 16 11/12/2016   LDLCALC 79 11/12/2016  Physical Findings: AIMS: Facial and Oral Movements Muscles of Facial Expression: None, normal Lips and Perioral Area: None, normal Jaw: None, normal Tongue: None, normal,Extremity Movements Upper (arms, wrists, hands, fingers): None, normal Lower (legs, knees, ankles, toes): None, normal, Trunk Movements Neck, shoulders, hips: None, normal, Overall Severity Severity of abnormal movements (highest score from questions above): None, normal Incapacitation due to abnormal movements: None, normal Patient's awareness of abnormal movements (rate only patient's report): No Awareness, Dental Status Current problems with teeth and/or dentures?: No Does patient usually wear dentures?: No  CIWA:  CIWA-Ar Total: 5 COWS:     Musculoskeletal: Strength & Muscle Tone: within normal limits Gait & Station: normal Patient leans: N/A  Psychiatric Specialty Exam: Physical Exam  Nursing note and vitals reviewed. Constitutional: She is oriented to person, place, and time. No distress.  HENT:  Head: Normocephalic and atraumatic.  Eyes: Pupils are equal, round, and reactive to light.  Neck: Normal range of motion. Neck supple.  Respiratory: Effort normal.  Neurological: She is alert and oriented to person, place, and time.  Skin: She is not diaphoretic.  Psychiatric:  As above    Review of Systems  Psychiatric/Behavioral: Positive for depression and substance abuse. Negative for hallucinations, memory loss and suicidal ideas. The patient is nervous/anxious. The patient does not have insomnia.   All other systems  reviewed and are negative.   Blood pressure 119/88, pulse 69, temperature 98.4 F (36.9 C), temperature source Oral, resp. rate 20, height 5\' 3"  (1.6 m), weight 240 lb (108.9 kg).Body mass index is 42.51 kg/m.  General Appearance: Casually dressed, was at the day room just before interview, calm and cooperative. A bit withdrawn. Not internally distracted.   Eye Contact:  Good  Speech:  Clear and Coherent and Normal Rate  Volume:  Soft spoken  Mood:  Worried, anxious    Affect:  Congruent and Restricted  Thought Process:  Linear  Orientation:  Full (Time, Place, and Person)  Thought Content:  Rumination about the past and the future. No hallucination in any modality. No thoughts of violence. No preoccupiations   Suicidal Thoughts:  No  Homicidal Thoughts:  No  Memory:  Immediate;   Good Recent;   Good Remote;   Good  Judgement:  Good  Insight:  Good  Psychomotor Activity:  Decreased  Concentration:  Concentration: Good and Attention Span: Good  Recall:  Good  Fund of Knowledge:  Good  Language:  Good  Akathisia:  NA  Handed:    AIMS (if indicated):     Assets:  Communication Skills Desire for Improvement Housing Physical Health  ADL's:  Intact  Cognition:  WNL  Sleep:  Number of Hours: 6.25     Treatment Plan Summary: Patient is coming off the effects of substances. She continues to ruminate on  situational stressors. She denies SI with plan or intent.  Patient is motivated to stay sober and agree to [facility] with [facility] after dischagre. She presents as no danger to herself or others. Reviewed current treatment plan and will continue current plan without adjustments;   Psychiatric: SUD SIMD MDD   Medical:  Psychosocial:  Financial difficulties  PLAN: 1. Continue medications at current dose; Prozac 20 mg po daily for depression, Vistaril 25 mg po q6hrs as needed for anxiety,Trazadone 50 mg po daily as needed for insomnia. Continue librium protocol for  withdrawal symptoms.   2. Encourage unit groups and activities 3. Monitor mood, behavior and interaction with peers 4. Would actively  seeking rehab 5. SW would facilitate aftercare 6. Hopeful discharge by Thursday.    Mordecai Maes, NP 11/14/2016, 11:33 AM   Patient ID: Polly Cobia, female   DOB: 1960-09-15, 56 y.o.   MRN: [id number]

## 2016-11-14 NOTE — Progress Notes (Signed)
Pt attended NA meeting this evening.  

## 2016-11-14 NOTE — Progress Notes (Signed)
Recreation Therapy Notes  Date:11/14/2016 Time:9:30am Location: [address] Dayroom  Group Topic: Stress Management  Goal Area(s) Addresses:  Patient will verbalize importance of using healthy stress management.  Patient will identify positive emotions associated with healthy stress management.   Intervention: Stress Management  Activity: Visual Relaxation. Recreation Therapy Intern introduced the stress management technique of visual relaxation. Recreation Therapy Intern read a script that allowed patients to take mental trip to the beach. Recreation Therapy Intern played calming beach wave sounds. Patients were to follow along as script was read to engage in the activity.  Education:Stress Management, Discharge Planning.   Education Outcome:Needs additional education  Clinical Observations/Feedback:Pt did not attend group.  Donovan Kail, Recreation Therapy Intern  Victorino Sparrow, LRT/CTRS

## 2016-11-14 NOTE — B[facility] Suicide Risk Assessment (Signed)
[facility] Discharge Suicide Risk Assessment   Principal Problem: Severe major depression [facility]) Discharge Diagnoses:  Patient Active Problem List   Diagnosis Date Noted  . Substance induced mood disorder ([place]) [F19.94]   . Alcohol abuse [F10.10]   . Cocaine abuse with cocaine-induced mood disorder ([place]) [F14.14]   . Nicotine abuse [Z72.0]   . Severe major depression ([place]) [F32.2] 11/10/2016  . Abnormal uterine bleeding [N93.9] 09/30/2013  . Menorrhagia [N92.0] 10/31/2012  . Anemia [D64.9] 10/31/2012  . OBESITY, NOS [E66.9] 06/13/2006    Total Time spent with patient: 45 minutes  Musculoskeletal: Strength & Muscle Tone: within normal limits Gait & Station: normal Patient leans: N/A  Psychiatric Specialty Exam: Review of Systems  Constitutional: Negative.   HENT: Negative.   Eyes: Negative.   Respiratory: Negative.   Cardiovascular: Negative.   Gastrointestinal: Negative.   Genitourinary: Negative.   Musculoskeletal: Negative.   Skin: Negative.   Neurological: Negative.   Endo/Heme/Allergies: Negative.   Psychiatric/Behavioral: Negative for depression, hallucinations, memory loss and suicidal ideas. The patient is not nervous/anxious and does not have insomnia.     Blood pressure 127/76, pulse 71, temperature 98.4 F (36.9 C), temperature source Oral, resp. rate 20, height 5\' 3"  (1.6 m), weight 108.9 kg (240 lb).Body mass index is 42.51 kg/m.  General Appearance: Neatly dressed, pleasant, engaging well and cooperative. Appropriate behavior. Not in any distress. Good relatedness. Not internally stimulated  Eye Contact::  Good  Speech:  Spontaneous, normal prosody. Normal tone and rate.   Volume:  Normal  Mood:  Euthymic  Affect:  Appropriate and Full Range  Thought Process:  Linear  Orientation:  Full (Time, Place, and Person)  Thought Content:  No delusional theme. No preoccupation with violent thoughts. No negative ruminations. No obsession.  No hallucination in any  modality.   Suicidal Thoughts:  No  Homicidal Thoughts:  No  Memory:  Immediate;   Good Recent;   Good Remote;   Good  Judgement:  Good  Insight:  Good  Psychomotor Activity:  Normal  Concentration:  Good  Recall:  Good  Fund of Knowledge:Good  Language: Good  Akathisia:  Negative  Handed:    AIMS (if indicated):     Assets:  Communication Skills Desire for Improvement Resilience Social Support  Sleep:  Number of Hours: 6.25  Cognition: WNL  ADL's:  Intact   Clinical Assessment::   56 yo  AAF, single lives with her sons, employed at [employer]. 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 Presented to the ER in own transport. She was accompanied by her daughter. She reported struggling with alcohol and cocaine. Reported transient hallucinations while under the influence. She was seeking help to recover. Hopes to get into long term rehab. UDS was positive for cocaine. Other parameters were normal.   Seen today. Reports worries but optimistic she is taking the right step. No suicidal thoughts. Not depressed. Has been maintaining normal biological functions. No evidence of psychosis. No evidence of mania. No overwhelming anxiety. She is going into a structured environment. No access to weapons.   Nursing staff reports that patient has been appropriate on the unit. Patient is a bit withdrawn. She has been in touch with her family and discussed plans for rehab. Worried about her mortgage and her family. Focused on dealing with her addiction.  No behavioral issues. Patient has not voiced any suicidal thoughts. Patient has not been observed to be internally stimulated. Patient has been adherent with treatment recommendations. Patient has been  tolerating their medication well.    Demographic Factors:  NA  Loss Factors: NA  Historical Factors: Impulsivity  Risk Reduction Factors:   Living with another person, especially a relative, Positive social support, Positive therapeutic  relationship and Positive coping skills or problem solving skills  Continued Clinical Symptoms:   As above   Cognitive Features That Contribute To Risk:  None    Suicide Risk:  Minimal: No identifiable suicidal ideation.  Patient is not having any thoughts of suicide at this time. Modifiable risk factors targeted during this admission includes depression, chronic pain and substance use. Demographical and historical risk factors cannot be modified. Patient is now engaging well. Patient is reliable and is future oriented. We have buffered patient's support structures. At this point, patient is at low risk of suicide. Patient is aware of the effects of psychoactive substances on decision making process. Patient has been provided with emergency contacts. Patient acknowledges to use resources provided if unforseen circumstances changes their current risk stratification.   Follow-up Information    [facility] Follow up on 11/15/2016.   Why:  Screening for possible admission on Thursday 8/2 at 7:45AM. Please bring: photo ID/proof of [employer], clothing, and medication/prescriptions provided by hospital. Thank you.  Contact information: [place] [address] [phone]        [facility] Follow up.   Specialty:  Behavioral Health Why:  Walk in within 7 days of hospital/rehab discharge to be assessed for outpatient mental health services. Walk in hours: Monday through Friday 8am-9am. Thank you.  Contact information: [place] [address] [phone]           Plan Of Care/Follow-up recommendations:  1. Continue current psychotropic medications 2. Mental health and addiction follow up as arranged.  3. Provided limited quantity of prescriptions   [place], MD 11/14/2016, 5:56 PM

## 2016-11-14 NOTE — Tx Team (Signed)
Interdisciplinary Treatment and Diagnostic Plan Update  11/14/2016 Time of Session: 1914NW Tamara Short MRN: [id number]  Principal Diagnosis: Major Depressive Disorder, recurrent, severe  Secondary Diagnoses: Principal Problem:   Severe major depression ([place]) Active Problems:   Substance induced mood disorder ([place])   Alcohol abuse   Cocaine abuse with cocaine-induced mood disorder ([facility])   Nicotine abuse   Current Medications:  Current Facility-Administered Medications  Medication Dose Route Frequency Provider Last Rate Last Dose  . acetaminophen (TYLENOL) tablet 650 mg  650 mg Oral Q6H PRN Lindon Romp A, NP      . alum & mag hydroxide-simeth (MAALOX/MYLANTA) 200-200-20 MG/5ML suspension 30 mL  30 mL Oral Q4H PRN Lindon Romp A, NP      . chlordiazePOXIDE (LIBRIUM) capsule 25 mg  25 mg Oral Q6H PRN Money, Lowry Ram, FNP   25 mg at 11/13/16 2148  . FLUoxetine (PROZAC) capsule 20 mg  20 mg Oral Daily Ambrose Finland, MD   20 mg at 11/14/16 0837  . hydrOXYzine (ATARAX/VISTARIL) tablet 25 mg  25 mg Oral Q6H PRN Money, Lowry Ram, FNP   25 mg at 11/12/16 2016  . loperamide (IMODIUM) capsule 2-4 mg  2-4 mg Oral PRN Money, Lowry Ram, FNP      . magnesium hydroxide (MILK OF MAGNESIA) suspension 30 mL  30 mL Oral Daily PRN Lindon Romp A, NP      . multivitamin with minerals tablet 1 tablet  1 tablet Oral Daily Money, Lowry Ram, FNP   1 tablet at 11/14/16 [phone]  . ondansetron (ZOFRAN-ODT) disintegrating tablet 4 mg  4 mg Oral Q6H PRN Money, Lowry Ram, FNP      . thiamine (VITAMIN B-1) tablet 100 mg  100 mg Oral Daily Money, Lowry Ram, FNP   100 mg at 11/14/16 4696  . traZODone (DESYREL) tablet 50 mg  50 mg Oral QHS,MR X 1 Lindon Romp A, NP   50 mg at 11/13/16 2148   PTA Medications: No prescriptions prior to admission.    Patient Stressors: Marital or family conflict Medication change or noncompliance Substance abuse  Patient Strengths: Technical sales engineer for  treatment/growth  Treatment Modalities: Medication Management, Group therapy, Case management,  1 to 1 session with clinician, Psychoeducation, Recreational therapy.   Physician Treatment Plan for Primary Diagnosis: Major Depressive Disorder, recurrent, severe  Long Term Goal(s): Improvement in symptoms so as ready for discharge Improvement in symptoms so as ready for discharge   Short Term Goals: Ability to identify changes in lifestyle to reduce recurrence of condition will improve Ability to verbalize feelings will improve Compliance with prescribed medications will improve Ability to identify triggers associated with substance abuse/mental health issues will improve Ability to disclose and discuss suicidal ideas Ability to identify and develop effective coping behaviors will improve  Medication Management: Evaluate patient's response, side effects, and tolerance of medication regimen.  Therapeutic Interventions: 1 to 1 sessions, Unit Group sessions and Medication administration.  Evaluation of Outcomes: Adequate for discharge   Physician Treatment Plan for Secondary Diagnosis: Principal Problem:   Severe major depression ([place]) Active Problems:   Substance induced mood disorder ([facility])   Alcohol abuse   Cocaine abuse with cocaine-induced mood disorder ([place])   Nicotine abuse  Long Term Goal(s): Improvement in symptoms so as ready for discharge Improvement in symptoms so as ready for discharge   Short Term Goals: Ability to identify changes in lifestyle to reduce recurrence of condition will improve Ability to verbalize feelings will  improve Compliance with prescribed medications will improve Ability to identify triggers associated with substance abuse/mental health issues will improve Ability to disclose and discuss suicidal ideas Ability to identify and develop effective coping behaviors will improve     Medication Management: Evaluate patient's response, side effects, and  tolerance of medication regimen.  Therapeutic Interventions: 1 to 1 sessions, Unit Group sessions and Medication administration.  Evaluation of Outcomes: Adequate for discharge    RN Treatment Plan for Primary Diagnosis: Major Depressive Disorder, recurrent, severe  Long Term Goal(s): Knowledge of disease and therapeutic regimen to maintain health will improve  Short Term Goals: Ability to remain free from injury will improve, Ability to verbalize feelings will improve and Ability to disclose and discuss suicidal ideas  Medication Management: RN will administer medications as ordered by provider, will assess and evaluate patient's response and provide education to patient for prescribed medication. RN will report any adverse and/or side effects to prescribing provider.  Therapeutic Interventions: 1 on 1 counseling sessions, Psychoeducation, Medication administration, Evaluate responses to treatment, Monitor vital signs and CBGs as ordered, Perform/monitor CIWA, COWS, AIMS and Fall Risk screenings as ordered, Perform wound care treatments as ordered.  Evaluation of Outcomes: Adequate for discharge    LCSW Treatment Plan for Primary Diagnosis: Major Depressive Disorder, recurrent, severe  Long Term Goal(s): Safe transition to appropriate next level of care at discharge, Engage patient in therapeutic group addressing interpersonal concerns.  Short Term Goals: Engage patient in aftercare planning with referrals and resources, Facilitate patient progression through stages of change regarding substance use diagnoses and concerns and Identify triggers associated with mental health/substance abuse issues  Therapeutic Interventions: Assess for all discharge needs, 1 to 1 time with Social worker, Explore available resources and support systems, Assess for adequacy in community support network, Educate family and significant other(s) on suicide prevention, Complete Psychosocial Assessment, Interpersonal  group therapy.  Evaluation of Outcomes:Adequate for discharge    Progress in Treatment: Attending groups: Yes. Participating in groups: Yes. Taking medication as prescribed: Yes. Toleration medication: Yes. Family/Significant other contact made: SPE completed with pt; pt declined to consent to family contact.  Patient understands diagnosis: Yes. Discussing patient identified problems/goals with staff: Yes. Medical problems stabilized or resolved: Yes. Denies suicidal/homicidal ideation: Yes. Issues/concerns per patient self-inventory: No. Other: n/a   New problem(s) identified: No, Describe:  n/a  New Short Term/Long Term Goal(s): detox, medication management for mood stabilization, elimination of SI thoughts; development of comprehensive mental wellness/sobriety plan.   Discharge Plan or Barriers: [facility] on [facility] at 7AM. [facility] for outpatient services. [place] pamphlet provided for additional community support/resources (AA/NA list for [facility] also provided).   Reason for Continuation of Hospitalization: medication management   Estimated Length of Stay: Thursday 11/15/16--taxi voucher in chart. PT MUST DISCHARGE NO LATE THAN 7AM.   Attendees: Patient: 11/14/2016 10:56 AM  Physician: Dr. Parke Poisson MD; Dr. Sanjuana Letters MD 11/14/2016 10:56 AM  Nursing: Jinny Sanders  RN; Christa RN 11/14/2016 10:56 AM  RN Care Manager: x 11/14/2016 10:56 AM  Social Worker: Maxie Better, LCSW 11/14/2016 10:56 AM  Recreational Therapist: Rhunette Croft 11/14/2016 10:56 AM  Other: Ricky Ala NP; Mordecai Maes NP; Darnelle Maffucci Money NP 11/14/2016 10:56 AM  Other:  11/14/2016 10:56 AM  Other: 11/14/2016 10:56 AM    Scribe for Treatment Team: [place], LCSW 11/14/2016 10:56 AM

## 2016-11-14 NOTE — Progress Notes (Signed)
Adult Psychoeducational Group Note  Date:  11/14/2016 Time:  12:51 AM  Group Topic/Focus:  Wrap-Up Group:   The focus of this group is to help patients review their daily goal of treatment and discuss progress on daily workbooks.  Participation Level:  Minimal  Participation Quality:  Appropriate  Affect:  Appropriate  Cognitive:  Appropriate  Insight: Limited  Engagement in Group:  Limited  Modes of Intervention:  Discussion  Additional Comments:  Pt stated her goal for today was finding better ways to deal with her angry issues. Pt stated she had a good for the most part and rated her overall day a 6 out of 10. Pt stated she attended all groups.  Candy Sledge 11/14/2016, 12:51 AM

## 2016-11-14 NOTE — Plan of Care (Signed)
Problem: Safety: Goal: Ability to remain free from injury will improve Outcome: Completed/Met Date Met: 11/14/16 Patient has not engaged in self harm. Denies SI, thoughts to harm self.  Problem: Health Behavior/Discharge Planning: Goal: Identification of resources available to assist in meeting health care needs will improve Outcome: Completed/Met Date Met: 11/14/16 Patient aware of plan for discharge to [facility] residential in the morning.

## 2016-11-14 NOTE — Progress Notes (Signed)
  [facility] Adult Case Management Discharge Plan :  Will you be returning to the same living situation after discharge:  No. [facility] scheduled for Thursday at 7:45AM.  At discharge, do you have transportation home?: Yes,  Taxi voucher in chart. PATIENT MUST DISCHARGE NO LATER THAN 7AM VIA TAXI ON Thursday IN ORDER TO MAKE IT TO [facility] FOR SCREENING. Do you have the ability to pay for your medications: Yes,  mental health  Release of information consent forms completed and submitted to medical records by CSW.  Patient to Follow up at: Follow-up Information    [facility] Follow up on 11/15/2016.   Why:  Screening for possible admission on Thursday 8/2 at 7:45AM. Please bring: photo ID/proof of [employer], clothing, and medication/prescriptions provided by hospital. Thank you.  Contact information: [place] [address] [phone]        [facility] Follow up.   Specialty:  Behavioral Health Why:  Walk in within 7 days of hospital/rehab discharge to be assessed for outpatient mental health services. Walk in hours: Monday through Friday 8am-9am. Thank you.  Contact information: [place] [address] [phone]           Next level of care provider has access to [place] and Suicide Prevention discussed: Yes,  SPE completed with pt; pt declined to consent to family contact. [facility] pamphlet and Mobile Crisis information provided.   Have you used any form of tobacco in the last 30 days? (Cigarettes, Smokeless Tobacco, Cigars, and/or Pipes): Yes  Has patient been referred to the Quitline?: Patient refused referral  Patient has been referred for addiction treatment: Yes  [employer], LCSW 11/14/2016, 10:55 AM

## 2016-11-15 ENCOUNTER — Encounter (HOSPITAL_COMMUNITY): Payer: Self-pay | Admitting: Behavioral Health

## 2016-11-15 NOTE — Progress Notes (Signed)
Pt discharged from unit after AVS, transition report, prescriptions, and suicide risk assessment were reviewed. Pt confirmed information with teach back. Pt denies SI/HI/AVH and contracts to seek support if thoughts as such occur. Patient was offered support and encouragement. All belongings returned to patient. Pts questions were answered and was escorted safely to transportation.   

## 2019-02-24 ENCOUNTER — Ambulatory Visit (INDEPENDENT_AMBULATORY_CARE_PROVIDER_SITE_OTHER): Payer: Self-pay | Admitting: Primary Care

## 2019-03-18 ENCOUNTER — Ambulatory Visit: Payer: Self-pay | Admitting: Gerontology

## 2019-03-24 ENCOUNTER — Ambulatory Visit: Payer: Self-pay | Admitting: Gerontology

## 2019-03-26 ENCOUNTER — Other Ambulatory Visit: Payer: Self-pay

## 2019-03-26 ENCOUNTER — Ambulatory Visit: Payer: Medicaid Other | Admitting: Gerontology

## 2019-03-26 ENCOUNTER — Encounter: Payer: Self-pay | Admitting: Gerontology

## 2019-03-26 VITALS — BP 135/85 | HR 68 | Ht 60.0 in | Wt 251.0 lb

## 2019-03-26 DIAGNOSIS — Z7689 Persons encountering health services in other specified circumstances: Secondary | ICD-10-CM

## 2019-03-26 NOTE — Progress Notes (Signed)
Patient ID: Tamara Short, female   DOB: [birth year], 58 y.o.   MRN: [id number]  Chief Complaint  Patient presents with  . Establish Care    HPI Tamara Short is a 58 y.o. female who presents to establish care. She was seen at [facility] for cannabis, cocaine, and alcohol use on 12/21/2018. She was started on Fluoxetine 20 mg capsule daily. She states that she has being sober for more than two months. She states that her mood is good, denies suicidal nor homicidal ideation and will follow up at [facility]. She reports that she has not had Mammogram, Pap smear nor Colonoscopy done. She denies chest pain, palpitation, shortness of breath, fever and chills. She states that she's doing well and offers no further complaints.   Past Medical History:  Diagnosis Date  . Anxiety   . Depression   . Fibroid   . Medical history non-contributory     Past Surgical History:  Procedure Laterality Date  . cyst removed Left 1983  . HYSTEROSCOPY WITH NOVASURE N/A 02/02/2014   Procedure: Dilatation and Curretage Hysteroscopy with resection of fibroid.HYSTEROSCOPY WITH attempted novasure. HYSTEROSCOPY WITH attempted  HYDROTHERMAL ABLATION;  Surgeon: Mora Bellman, MD;  Location: [facility];  Service: Gynecology;  Laterality: N/A;  . NO PAST SURGERIES      Family History  Problem Relation Age of Onset  . Alcohol abuse Neg Hx   . Arthritis Neg Hx   . Asthma Neg Hx   . Birth defects Neg Hx   . Cancer Neg Hx   . COPD Neg Hx   . Depression Neg Hx   . Diabetes Neg Hx   . Drug abuse Neg Hx   . Early death Neg Hx   . Hearing loss Neg Hx   . Heart disease Neg Hx   . Hyperlipidemia Neg Hx   . Hypertension Neg Hx   . Kidney disease Neg Hx   . Learning disabilities Neg Hx   . Mental illness Neg Hx   . Mental retardation Neg Hx   . Miscarriages / Stillbirths Neg Hx   . Stroke Neg Hx   . Vision loss Neg Hx   . Varicose Veins Neg Hx     Social History Social History   Tobacco Use   . Smoking status: Former Smoker    Types: Cigarettes    Quit date: 12/22/2018    Years since quitting: 0.2  . Smokeless tobacco: Never Used  Substance Use Topics  . Alcohol use: No  . Drug use: No    No Known Allergies  Current Outpatient Medications  Medication Sig Dispense Refill  . FLUoxetine (PROZAC) 20 MG capsule Take 1 capsule (20 mg total) by mouth daily. 30 capsule 0  . hydrOXYzine (ATARAX/VISTARIL) 25 MG tablet Take 1 tablet (25 mg total) by mouth every 6 (six) hours as needed for anxiety (or CIWA score </= 10). 30 tablet 0  . Multiple Vitamin (MULTIVITAMIN) tablet Take 1 tablet by mouth daily.    . traZODone (DESYREL) 50 MG tablet Take 1 tablet (50 mg total) by mouth at bedtime and may repeat dose one time if needed. 30 tablet 0   No current facility-administered medications for this visit.    Review of Systems Review of Systems  Constitutional: Negative.   HENT: Negative.   Eyes: Negative.   Respiratory: Negative.   Cardiovascular: Negative.   Gastrointestinal: Negative.   Endocrine: Negative.   Genitourinary: Negative.   Musculoskeletal: Negative.  Skin: Negative.   Neurological: Negative.   Psychiatric/Behavioral: Negative.     Blood pressure 135/85, pulse 68, height 5' (1.524 m), weight 251 lb (113.9 kg), SpO2 99 %.  Physical Exam Physical Exam Constitutional:      Appearance: Normal appearance.  HENT:     Head: Normocephalic and atraumatic.     Nose: Nose normal.     Mouth/Throat:     Mouth: Mucous membranes are moist.  Eyes:     Extraocular Movements: Extraocular movements intact.     Pupils: Pupils are equal, round, and reactive to light.  Cardiovascular:     Rate and Rhythm: Normal rate and regular rhythm.     Pulses: Normal pulses.     Heart sounds: Normal heart sounds.  Pulmonary:     Effort: Pulmonary effort is normal.     Breath sounds: Normal breath sounds.  Abdominal:     General: Bowel sounds are normal.     Palpations: Abdomen is  soft.  Genitourinary:    Comments: Deferred per patient. Musculoskeletal:        General: Normal range of motion.     Cervical back: Normal range of motion.  Skin:    General: Skin is warm and dry.  Neurological:     General: No focal deficit present.     Mental Status: She is alert and oriented to person, place, and time.  Psychiatric:        Mood and Affect: Mood normal.        Behavior: Behavior normal.        Thought Content: Thought content normal.        Judgment: Judgment normal.     Data Reviewed Lab and medical history was reviewed.  Assessment and Plan  1. Encounter to establish care - Routine labs will be collected. - Ambulatory referral to Hematology / Oncology for Papsmear and Mammogram - She was encouraged to complete charity care application for Ambulatory referral to Gastroenterology for Colonoscopy. - TSH; Future - CBC w/Diff; Future - Comp Met (CMET); Future - Lipid panel; Future - Urinalysis; Future - HgB A1c; Future   Follow up: In 3 months ( 06/24/2019) if symptom worsens or fail to improve.  Tamara Short E Sayed Apostol 03/29/2019, 7:53 PM

## 2019-03-26 NOTE — Patient Instructions (Addendum)
DASH Eating Plan DASH stands for "Dietary Approaches to Stop Hypertension." The DASH eating plan is a healthy eating plan that has been shown to reduce high blood pressure (hypertension). It may also reduce your risk for type 2 diabetes, heart disease, and stroke. The DASH eating plan may also help with weight loss. What are tips for following this plan?  General guidelines  Avoid eating more than 2,300 mg (milligrams) of salt (sodium) a day. If you have hypertension, you may need to reduce your sodium intake to 1,500 mg a day.  Limit alcohol intake to no more than 1 drink a day for nonpregnant women and 2 drinks a day for men. One drink equals 12 oz of beer, 5 oz of wine, or 1 oz of hard liquor.  Work with your health care provider to maintain a healthy body weight or to lose weight. Ask what an ideal weight is for you.  Get at least 30 minutes of exercise that causes your heart to beat faster (aerobic exercise) most days of the week. Activities may include walking, swimming, or biking.  Work with your health care provider or diet and nutrition specialist (dietitian) to adjust your eating plan to your individual calorie needs. Reading food labels   Check food labels for the amount of sodium per serving. Choose foods with less than 5 percent of the Daily Value of sodium. Generally, foods with less than 300 mg of sodium per serving fit into this eating plan.  To find whole grains, look for the word "whole" as the first word in the ingredient list. Shopping  Buy products labeled as "low-sodium" or "no salt added."  Buy fresh foods. Avoid canned foods and premade or frozen meals. Cooking  Avoid adding salt when cooking. Use salt-free seasonings or herbs instead of table salt or sea salt. Check with your health care provider or pharmacist before using salt substitutes.  Do not fry foods. Cook foods using healthy methods such as baking, boiling, grilling, and broiling instead.  Cook with  heart-healthy oils, such as olive, canola, soybean, or sunflower oil. Meal planning  Eat a balanced diet that includes: ? 5 or more servings of fruits and vegetables each day. At each meal, try to fill half of your plate with fruits and vegetables. ? Up to 6-8 servings of whole grains each day. ? Less than 6 oz of lean meat, poultry, or fish each day. A 3-oz serving of meat is about the same size as a deck of cards. One egg equals 1 oz. ? 2 servings of low-fat dairy each day. ? A serving of nuts, seeds, or beans 5 times each week. ? Heart-healthy fats. Healthy fats called Omega-3 fatty acids are found in foods such as flaxseeds and coldwater fish, like sardines, salmon, and mackerel.  Limit how much you eat of the following: ? Canned or prepackaged foods. ? Food that is high in trans fat, such as fried foods. ? Food that is high in saturated fat, such as fatty meat. ? Sweets, desserts, sugary drinks, and other foods with added sugar. ? Full-fat dairy products.  Do not salt foods before eating.  Try to eat at least 2 vegetarian meals each week.  Eat more home-cooked food and less restaurant, buffet, and fast food.  When eating at a restaurant, ask that your food be prepared with less salt or no salt, if possible. What foods are recommended? The items listed may not be a complete list. Talk with your dietitian about   what dietary choices are best for you. Grains Whole-grain or whole-wheat bread. Whole-grain or whole-wheat pasta. Brown rice. Oatmeal. Quinoa. Bulgur. Whole-grain and low-sodium cereals. Pita bread. Low-fat, low-sodium crackers. Whole-wheat flour tortillas. Vegetables Fresh or frozen vegetables (raw, steamed, roasted, or grilled). Low-sodium or reduced-sodium tomato and vegetable juice. Low-sodium or reduced-sodium tomato sauce and tomato paste. Low-sodium or reduced-sodium canned vegetables. Fruits All fresh, dried, or frozen fruit. Canned fruit in natural juice (without  added sugar). Meat and other protein foods Skinless chicken or turkey. Ground chicken or turkey. Pork with fat trimmed off. Fish and seafood. Egg whites. Dried beans, peas, or lentils. Unsalted nuts, nut butters, and seeds. Unsalted canned beans. Lean cuts of beef with fat trimmed off. Low-sodium, lean deli meat. Dairy Low-fat (1%) or fat-free (skim) milk. Fat-free, low-fat, or reduced-fat cheeses. Nonfat, low-sodium ricotta or cottage cheese. Low-fat or nonfat yogurt. Low-fat, low-sodium cheese. Fats and oils Soft margarine without trans fats. Vegetable oil. Low-fat, reduced-fat, or light mayonnaise and salad dressings (reduced-sodium). Canola, safflower, olive, soybean, and sunflower oils. Avocado. Seasoning and other foods Herbs. Spices. Seasoning mixes without salt. Unsalted popcorn and pretzels. Fat-free sweets. What foods are not recommended? The items listed may not be a complete list. Talk with your dietitian about what dietary choices are best for you. Grains Baked goods made with fat, such as croissants, muffins, or some breads. Dry pasta or rice meal packs. Vegetables Creamed or fried vegetables. Vegetables in a cheese sauce. Regular canned vegetables (not low-sodium or reduced-sodium). Regular canned tomato sauce and paste (not low-sodium or reduced-sodium). Regular tomato and vegetable juice (not low-sodium or reduced-sodium). Pickles. Olives. Fruits Canned fruit in a light or heavy syrup. Fried fruit. Fruit in cream or butter sauce. Meat and other protein foods Fatty cuts of meat. Ribs. Fried meat. Bacon. Sausage. Bologna and other processed lunch meats. Salami. Fatback. Hotdogs. Bratwurst. Salted nuts and seeds. Canned beans with added salt. Canned or smoked fish. Whole eggs or egg yolks. Chicken or turkey with skin. Dairy Whole or 2% milk, cream, and half-and-half. Whole or full-fat cream cheese. Whole-fat or sweetened yogurt. Full-fat cheese. Nondairy creamers. Whipped toppings.  Processed cheese and cheese spreads. Fats and oils Butter. Stick margarine. Lard. Shortening. Ghee. Bacon fat. Tropical oils, such as coconut, palm kernel, or palm oil. Seasoning and other foods Salted popcorn and pretzels. Onion salt, garlic salt, seasoned salt, table salt, and sea salt. Worcestershire sauce. Tartar sauce. Barbecue sauce. Teriyaki sauce. Soy sauce, including reduced-sodium. Steak sauce. Canned and packaged gravies. Fish sauce. Oyster sauce. Cocktail sauce. Horseradish that you find on the shelf. Ketchup. Mustard. Meat flavorings and tenderizers. Bouillon cubes. Hot sauce and Tabasco sauce. Premade or packaged marinades. Premade or packaged taco seasonings. Relishes. Regular salad dressings. Where to find more information:  National Heart, Lung, and Blood Institute: www.nhlbi.nih.gov  American Heart Association: www.heart.org Summary  The DASH eating plan is a healthy eating plan that has been shown to reduce high blood pressure (hypertension). It may also reduce your risk for type 2 diabetes, heart disease, and stroke.  With the DASH eating plan, you should limit salt (sodium) intake to 2,300 mg a day. If you have hypertension, you may need to reduce your sodium intake to 1,500 mg a day.  When on the DASH eating plan, aim to eat more fresh fruits and vegetables, whole grains, lean proteins, low-fat dairy, and heart-healthy fats.  Work with your health care provider or diet and nutrition specialist (dietitian) to adjust your eating plan to your   individual calorie needs. This information is not intended to replace advice given to you by your health care provider. Make sure you discuss any questions you have with your health care provider. Document Released: 03/22/2011 Document Revised: 03/15/2017 Document Reviewed: 03/26/2016 Elsevier Patient Education  2020 [employer] Calories are units of energy. Your body needs a certain amount of  calories from food to keep you going throughout the day. When you eat more calories than your body needs, your body stores the extra calories as fat. When you eat fewer calories than your body needs, your body burns fat to get the energy it needs. Calorie counting means keeping track of how many calories you eat and drink each day. Calorie counting can be helpful if you need to lose weight. If you make sure to eat fewer calories than your body needs, you should lose weight. Ask your health care provider what a healthy weight is for you. For calorie counting to work, you will need to eat the right number of calories in a day in order to lose a healthy amount of weight per week. A dietitian can help you determine how many calories you need in a day and will give you suggestions on how to reach your calorie goal.  A healthy amount of weight to lose per week is usually 1-2 lb (0.5-0.9 kg). This usually means that your daily calorie intake should be reduced by 500-750 calories.  Eating 1,200 - 1,500 calories per day can help most women lose weight.  Eating 1,500 - 1,800 calories per day can help most men lose weight. What is my plan? My goal is to have __________ calories per day. If I have this many calories per day, I should lose around __________ pounds per week. What do I need to know about calorie counting? In order to meet your daily calorie goal, you will need to:  Find out how many calories are in each food you would like to eat. Try to do this before you eat.  Decide how much of the food you plan to eat.  Write down what you ate and how many calories it had. Doing this is called keeping a food log. To successfully lose weight, it is important to balance calorie counting with a healthy lifestyle that includes regular activity. Aim for 150 minutes of moderate exercise (such as walking) or 75 minutes of vigorous exercise (such as running) each week. Where do I find calorie information?  The  number of calories in a food can be found on a Nutrition Facts label. If a food does not have a Nutrition Facts label, try to look up the calories online or ask your dietitian for help. Remember that calories are listed per serving. If you choose to have more than one serving of a food, you will have to multiply the calories per serving by the amount of servings you plan to eat. For example, the label on a package of bread might say that a serving size is 1 slice and that there are 90 calories in a serving. If you eat 1 slice, you will have eaten 90 calories. If you eat 2 slices, you will have eaten 180 calories. How do I keep a food log? Immediately after each meal, record the following information in your food log:  What you ate. Don't forget to include toppings, sauces, and other extras on the food.  How much you ate. This can be measured in  cups, ounces, or number of items.  How many calories each food and drink had.  The total number of calories in the meal. Keep your food log near you, such as in a small notebook in your pocket, or use a mobile app or website. Some programs will calculate calories for you and show you how many calories you have left for the day to meet your goal. What are some calorie counting tips?   Use your calories on foods and drinks that will fill you up and not leave you hungry: ? Some examples of foods that fill you up are nuts and nut butters, vegetables, lean proteins, and high-fiber foods like whole grains. High-fiber foods are foods with more than 5 g fiber per serving. ? Drinks such as sodas, specialty coffee drinks, alcohol, and juices have a lot of calories, yet do not fill you up.  Eat nutritious foods and avoid empty calories. Empty calories are calories you get from foods or beverages that do not have many vitamins or protein, such as candy, sweets, and soda. It is better to have a nutritious high-calorie food (such as an avocado) than a food with few  nutrients (such as a bag of chips).  Know how many calories are in the foods you eat most often. This will help you calculate calorie counts faster.  Pay attention to calories in drinks. Low-calorie drinks include water and unsweetened drinks.  Pay attention to nutrition labels for "low fat" or "fat free" foods. These foods sometimes have the same amount of calories or more calories than the full fat versions. They also often have added sugar, starch, or salt, to make up for flavor that was removed with the fat.  Find a way of tracking calories that works for you. Get creative. Try different apps or programs if writing down calories does not work for you. What are some portion control tips?  Know how many calories are in a serving. This will help you know how many servings of a certain food you can have.  Use a measuring cup to measure serving sizes. You could also try weighing out portions on a kitchen scale. With time, you will be able to estimate serving sizes for some foods.  Take some time to put servings of different foods on your favorite plates, bowls, and cups so you know what a serving looks like.  Try not to eat straight from a bag or box. Doing this can lead to overeating. Put the amount you would like to eat in a cup or on a plate to make sure you are eating the right portion.  Use smaller plates, glasses, and bowls to prevent overeating.  Try not to multitask (for example, watch TV or use your computer) while eating. If it is time to eat, sit down at a table and enjoy your food. This will help you to know when you are full. It will also help you to be aware of what you are eating and how much you are eating. What are tips for following this plan? Reading food labels  Check the calorie count compared to the serving size. The serving size may be smaller than what you are used to eating.  Check the source of the calories. Make sure the food you are eating is high in vitamins and  protein and low in saturated and trans fats. Shopping  Read nutrition labels while you shop. This will help you make healthy decisions before you decide to purchase  your food.  Make a grocery list and stick to it. Cooking  Try to cook your favorite foods in a healthier way. For example, try baking instead of frying.  Use low-fat dairy products. Meal planning  Use more fruits and vegetables. Half of your plate should be fruits and vegetables.  Include lean proteins like poultry and fish. How do I count calories when eating out?  Ask for smaller portion sizes.  Consider sharing an entree and sides instead of getting your own entree.  If you get your own entree, eat only half. Ask for a box at the beginning of your meal and put the rest of your entree in it so you are not tempted to eat it.  If calories are listed on the menu, choose the lower calorie options.  Choose dishes that include vegetables, fruits, whole grains, low-fat dairy products, and lean protein.  Choose items that are boiled, broiled, grilled, or steamed. Stay away from items that are buttered, battered, fried, or served with cream sauce. Items labeled "crispy" are usually fried, unless stated otherwise.  Choose water, low-fat milk, unsweetened iced tea, or other drinks without added sugar. If you want an alcoholic beverage, choose a lower calorie option such as a glass of wine or light beer.  Ask for dressings, sauces, and syrups on the side. These are usually high in calories, so you should limit the amount you eat.  If you want a salad, choose a garden salad and ask for grilled meats. Avoid extra toppings like bacon, cheese, or fried items. Ask for the dressing on the side, or ask for olive oil and vinegar or lemon to use as dressing.  Estimate how many servings of a food you are given. For example, a serving of cooked rice is  cup or about the size of half a baseball. Knowing serving sizes will help you be aware  of how much food you are eating at restaurants. The list below tells you how big or small some common portion sizes are based on everyday objects: ? 1 oz--4 stacked dice. ? 3 oz--1 deck of cards. ? 1 tsp--1 die. ? 1 Tbsp-- a ping-pong ball. ? 2 Tbsp--1 ping-pong ball. ?  cup-- baseball. ? 1 cup--1 baseball. Summary  Calorie counting means keeping track of how many calories you eat and drink each day. If you eat fewer calories than your body needs, you should lose weight.  A healthy amount of weight to lose per week is usually 1-2 lb (0.5-0.9 kg). This usually means reducing your daily calorie intake by 500-750 calories.  The number of calories in a food can be found on a Nutrition Facts label. If a food does not have a Nutrition Facts label, try to look up the calories online or ask your dietitian for help.  Use your calories on foods and drinks that will fill you up, and not on foods and drinks that will leave you hungry.  Use smaller plates, glasses, and bowls to prevent overeating. This information is not intended to replace advice given to you by your health care provider. Make sure you discuss any questions you have with your health care provider. Document Released: 04/02/2005 Document Revised: 12/20/2017 Document Reviewed: 03/02/2016 Elsevier Patient Education  2020 [employer].

## 2019-03-27 ENCOUNTER — Other Ambulatory Visit: Payer: Self-pay

## 2019-03-27 ENCOUNTER — Telehealth: Payer: Self-pay

## 2019-03-27 DIAGNOSIS — Z1211 Encounter for screening for malignant neoplasm of colon: Secondary | ICD-10-CM

## 2019-03-27 NOTE — Telephone Encounter (Signed)
Gastroenterology Pre-Procedure Review  Request Date: January 7th, 2021 Requesting Physician: Dr. Vicente Males  PATIENT REVIEW QUESTIONS: The patient responded to the following health history questions as indicated:    1. Are you having any GI issues? no 2. Do you have a personal history of Polyps? no 3. Do you have a family history of Colon Cancer or Polyps? no 4. Diabetes Mellitus? no 5. Joint replacements in the past 12 months?no 6. Major health problems in the past 3 months?no 7. Any artificial heart valves, MVP, or defibrillator?no    MEDICATIONS & ALLERGIES:    Patient reports the following regarding taking any anticoagulation/antiplatelet therapy:   Plavix, Coumadin, Eliquis, Xarelto, Lovenox, Pradaxa, Brilinta, or Effient? no Aspirin? no  Patient confirms/reports the following medications:  Current Outpatient Medications  Medication Sig Dispense Refill  . FLUoxetine (PROZAC) 20 MG capsule Take 1 capsule (20 mg total) by mouth daily. 30 capsule 0  . hydrOXYzine (ATARAX/VISTARIL) 25 MG tablet Take 1 tablet (25 mg total) by mouth every 6 (six) hours as needed for anxiety (or CIWA score </= 10). 30 tablet 0  . Multiple Vitamin (MULTIVITAMIN) tablet Take 1 tablet by mouth daily.    . traZODone (DESYREL) 50 MG tablet Take 1 tablet (50 mg total) by mouth at bedtime and may repeat dose one time if needed. 30 tablet 0   No current facility-administered medications for this visit.    Patient confirms/reports the following allergies:  No Known Allergies  No orders of the defined types were placed in this encounter.   AUTHORIZATION INFORMATION Primary Insurance: 1D#: Group #:  Secondary Insurance: 1D#: Group #:  SCHEDULE INFORMATION: Date: Thursday 04/23/19 Time: Location:[facility]

## 2019-04-01 ENCOUNTER — Ambulatory Visit: Payer: Medicaid Other

## 2019-04-02 ENCOUNTER — Ambulatory Visit: Payer: Medicaid Other

## 2019-04-02 ENCOUNTER — Other Ambulatory Visit: Payer: Self-pay

## 2019-04-02 DIAGNOSIS — Z7689 Persons encountering health services in other specified circumstances: Secondary | ICD-10-CM

## 2019-04-03 LAB — COMPREHENSIVE METABOLIC PANEL
ALT: 21 IU/L (ref 0–32)
AST: 27 IU/L (ref 0–40)
Albumin/Globulin Ratio: 1.6 (ref 1.2–2.2)
Albumin: 4.2 g/dL (ref 3.8–4.9)
Alkaline Phosphatase: 72 IU/L (ref 39–117)
BUN/Creatinine Ratio: 11 (ref 9–23)
BUN: 10 mg/dL (ref 6–24)
Bilirubin Total: 0.3 mg/dL (ref 0.0–1.2)
CO2: 26 mmol/L (ref 20–29)
Calcium: 9.8 mg/dL (ref 8.7–10.2)
Chloride: 100 mmol/L (ref 96–106)
Creatinine, Ser: 0.93 mg/dL (ref 0.57–1.00)
GFR calc Af Amer: 79 mL/min/{1.73_m2} (ref >59)
GFR calc non Af Amer: 69 mL/min/{1.73_m2} (ref >59)
Globulin, Total: 2.7 g/dL (ref 1.5–4.5)
Glucose: 93 mg/dL (ref 65–99)
Potassium: 4.2 mmol/L (ref 3.5–5.2)
Sodium: 142 mmol/L (ref 134–144)
Total Protein: 6.9 g/dL (ref 6.0–8.5)

## 2019-04-03 LAB — LIPID PANEL
Chol/HDL Ratio: 2.6 ratio (ref 0.0–4.4)
Cholesterol, Total: 191 mg/dL (ref 100–199)
HDL: 73 mg/dL (ref >39)
LDL Chol Calc (NIH): 101 mg/dL — ABNORMAL HIGH (ref 0–99)
Triglycerides: 94 mg/dL (ref 0–149)
VLDL Cholesterol Cal: 17 mg/dL (ref 5–40)

## 2019-04-03 LAB — URINALYSIS
Bilirubin, UA: NEGATIVE
Glucose, UA: NEGATIVE
Ketones, UA: NEGATIVE
Nitrite, UA: NEGATIVE
Protein,UA: NEGATIVE
RBC, UA: NEGATIVE
Specific Gravity, UA: 1.021 (ref 1.005–1.030)
Urobilinogen, Ur: 0.2 mg/dL (ref 0.2–1.0)
pH, UA: 6.5 (ref 5.0–7.5)

## 2019-04-03 LAB — CBC WITH DIFFERENTIAL/PLATELET
Basophils Absolute: 0.1 10*3/uL (ref 0.0–0.2)
Basos: 1 %
EOS (ABSOLUTE): 0.2 10*3/uL (ref 0.0–0.4)
Eos: 3 %
Hematocrit: 42.3 % (ref 34.0–46.6)
Hemoglobin: 13.9 g/dL (ref 11.1–15.9)
Immature Grans (Abs): 0 10*3/uL (ref 0.0–0.1)
Immature Granulocytes: 0 %
Lymphocytes Absolute: 2.2 10*3/uL (ref 0.7–3.1)
Lymphs: 33 %
MCH: 29.8 pg (ref 26.6–33.0)
MCHC: 32.9 g/dL (ref 31.5–35.7)
MCV: 91 fL (ref 79–97)
Monocytes Absolute: 0.7 10*3/uL (ref 0.1–0.9)
Monocytes: 10 %
Neutrophils Absolute: 3.7 10*3/uL (ref 1.4–7.0)
Neutrophils: 53 %
Platelets: 223 10*3/uL (ref 150–450)
RBC: 4.66 x10E6/uL (ref 3.77–5.28)
RDW: 12.7 % (ref 11.7–15.4)
WBC: 6.8 10*3/uL (ref 3.4–10.8)

## 2019-04-03 LAB — TSH: TSH: 0.613 u[IU]/mL (ref 0.450–4.500)

## 2019-04-03 LAB — HEMOGLOBIN A1C
Est. average glucose Bld gHb Est-mCnc: 105 mg/dL
Hgb A1c MFr Bld: 5.3 % (ref 4.8–5.6)

## 2019-04-15 ENCOUNTER — Other Ambulatory Visit: Payer: Self-pay | Admitting: Gerontology

## 2019-04-15 DIAGNOSIS — F322 Major depressive disorder, single episode, severe without psychotic features: Secondary | ICD-10-CM

## 2019-04-15 MED ORDER — HYDROXYZINE HCL 25 MG PO TABS: 25.0000 mg | ORAL_TABLET | Freq: Four times a day (QID) | ORAL | 0 refills | Status: AC | PRN

## 2019-04-15 MED ORDER — FLUOXETINE HCL 20 MG PO CAPS: 20.0000 mg | ORAL_CAPSULE | Freq: Every day | ORAL | 0 refills | Status: AC

## 2019-04-15 MED ORDER — TRAZODONE HCL 50 MG PO TABS: 50.0000 mg | ORAL_TABLET | Freq: Every evening | ORAL | 0 refills | Status: AC | PRN

## 2019-04-15 NOTE — Progress Notes (Signed)
Tamara Short states that she's being taking Trazodone 50 mg, Hydroxyzine 25 mg every 6 hours as needed and Fluoxetine 20 mg daily since September 2020. Medication was authorized by Mordecai Maes. 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 She denies any medication side effect, and was advised to notify clinic. Her medications will be refilled for 30 days.

## 2019-04-21 ENCOUNTER — Other Ambulatory Visit: Payer: Medicaid Other | Attending: Gastroenterology

## 2019-04-21 ENCOUNTER — Emergency Department
Admission: EM | Admit: 2019-04-21 | Discharge: 2019-04-21 | Disposition: A | Discharge status: Home / Self Care | Payer: Medicaid Other | Attending: Emergency Medicine | Admitting: Emergency Medicine

## 2019-04-21 ENCOUNTER — Encounter: Payer: Self-pay | Admitting: Emergency Medicine

## 2019-04-21 ENCOUNTER — Telehealth: Payer: Self-pay | Admitting: Gastroenterology

## 2019-04-21 ENCOUNTER — Other Ambulatory Visit: Payer: Self-pay

## 2019-04-21 DIAGNOSIS — M545 Low back pain: Secondary | ICD-10-CM | POA: Insufficient documentation

## 2019-04-21 DIAGNOSIS — R350 Frequency of micturition: Secondary | ICD-10-CM | POA: Insufficient documentation

## 2019-04-21 DIAGNOSIS — R109 Unspecified abdominal pain: Secondary | ICD-10-CM

## 2019-04-21 DIAGNOSIS — Z87891 Personal history of nicotine dependence: Secondary | ICD-10-CM | POA: Insufficient documentation

## 2019-04-21 LAB — URINALYSIS, COMPLETE (UACMP) WITH MICROSCOPIC
Bacteria, UA: NONE SEEN
Bilirubin Urine: NEGATIVE
Glucose, UA: NEGATIVE mg/dL
Hgb urine dipstick: NEGATIVE
Ketones, ur: NEGATIVE mg/dL
Leukocytes,Ua: NEGATIVE
Nitrite: NEGATIVE
Protein, ur: NEGATIVE mg/dL
Specific Gravity, Urine: 1.008 (ref 1.005–1.030)
pH: 5 (ref 5.0–8.0)

## 2019-04-21 LAB — POCT PREGNANCY, URINE: Preg Test, Ur: NEGATIVE

## 2019-04-21 MED ORDER — CYCLOBENZAPRINE HCL 10 MG PO TABS: 10.0000 mg | ORAL_TABLET | Freq: Three times a day (TID) | ORAL | 0 refills | Status: DC | PRN

## 2019-04-21 MED ORDER — IBUPROFEN 600 MG PO TABS: 600.0000 mg | ORAL_TABLET | Freq: Three times a day (TID) | ORAL | 0 refills | Status: DC | PRN

## 2019-04-21 NOTE — Discharge Instructions (Signed)
Follow discharge care instruction take medication as directed. °

## 2019-04-21 NOTE — ED Notes (Signed)
See triage note  Presents with lower back pain  States sx's have been going on for a while  Thinks sxs' are getting worse

## 2019-04-21 NOTE — Telephone Encounter (Signed)
Pt left vm to reschedule her procedure for 04/23/19

## 2019-04-21 NOTE — ED Provider Notes (Signed)
[facility] Emergency Department Provider Note   ____________________________________________   First MD Initiated Contact with Patient 04/21/19 1232     (approximate)  I have reviewed the triage vital signs and the nursing notes.   HISTORY  Chief Complaint Back Pain    HPI Tamara Short is a 59 y.o. female patient presents with 2 weeks of right flank pain.  Patient stated the radicular component to the right leg.  Patient pain wax and wane.  Patient rates pain increased with flexion forward.  Patient also stated that his urinary frequency.  Patient denies dysuria or vaginal discharge.  No fever or complaint.  No palliative measures for complaint.         Past Medical History:  Diagnosis Date  . Anxiety   . Depression   . Fibroid   . Medical history non-contributory     Patient Active Problem List   Diagnosis Date Noted  . Encounter to establish care 03/26/2019  . Substance induced mood disorder ([place])   . Alcohol abuse   . Cocaine abuse with cocaine-induced mood disorder ([place])   . Nicotine abuse   . Severe major depression ([place]) 11/10/2016  . Abnormal uterine bleeding 09/30/2013  . Menorrhagia 10/31/2012  . Anemia 10/31/2012  . OBESITY, NOS 06/13/2006    Past Surgical History:  Procedure Laterality Date  . cyst removed Left 1983  . HYSTEROSCOPY WITH NOVASURE N/A 02/02/2014   Procedure: Dilatation and Curretage Hysteroscopy with resection of fibroid.HYSTEROSCOPY WITH attempted novasure. HYSTEROSCOPY WITH attempted  HYDROTHERMAL ABLATION;  Surgeon: Mora Bellman, MD;  Location: [facility];  Service: Gynecology;  Laterality: N/A;  . NO PAST SURGERIES      Prior to Admission medications   Medication Sig Start Date End Date Taking? Authorizing Provider  cyclobenzaprine (FLEXERIL) 10 MG tablet Take 1 tablet (10 mg total) by mouth 3 (three) times daily as needed. 04/21/19   Sable Feil, PA-C  FLUoxetine (PROZAC) 20 MG capsule Take 1  capsule (20 mg total) by mouth daily. 04/15/19   Iloabachie, Chioma E, NP  hydrOXYzine (ATARAX/VISTARIL) 25 MG tablet Take 1 tablet (25 mg total) by mouth every 6 (six) hours as needed for anxiety (or CIWA score </= 10). 04/15/19   Iloabachie, Chioma E, NP  ibuprofen (ADVIL) 600 MG tablet Take 1 tablet (600 mg total) by mouth every 8 (eight) hours as needed. 04/21/19   Sable Feil, PA-C  Multiple Vitamin (MULTIVITAMIN) tablet Take 1 tablet by mouth daily.    [provider]  traZODone (DESYREL) 50 MG tablet Take 1 tablet (50 mg total) by mouth at bedtime and may repeat dose one time if needed. 04/15/19   Iloabachie, Chioma E, NP    Allergies Patient has no known allergies.  Family History  Problem Relation Age of Onset  . Alcohol abuse Neg Hx   . Arthritis Neg Hx   . Asthma Neg Hx   . Birth defects Neg Hx   . Cancer Neg Hx   . COPD Neg Hx   . Depression Neg Hx   . Diabetes Neg Hx   . Drug abuse Neg Hx   . Early death Neg Hx   . Hearing loss Neg Hx   . Heart disease Neg Hx   . Hyperlipidemia Neg Hx   . Hypertension Neg Hx   . Kidney disease Neg Hx   . Learning disabilities Neg Hx   . Mental illness Neg Hx   . Mental retardation Neg Hx   .  Miscarriages / Stillbirths Neg Hx   . Stroke Neg Hx   . Vision loss Neg Hx   . Varicose Veins Neg Hx     Social History Social History   Tobacco Use  . Smoking status: Former Smoker    Types: Cigarettes    Quit date: 12/22/2018    Years since quitting: 0.3  . Smokeless tobacco: Never Used  Substance Use Topics  . Alcohol use: No  . Drug use: No    Review of Systems Constitutional: No fever/chills Eyes: No visual changes. ENT: No sore throat. Cardiovascular: Denies chest pain. Respiratory: Denies shortness of breath. Gastrointestinal: No abdominal pain.  No nausea, no vomiting.  No diarrhea.  No constipation. Genitourinary: Negative for dysuria. Musculoskeletal: Negative for back pain. Skin: Negative for  rash. Neurological: Negative for headaches, focal weakness or numbness. Psychiatric:  Anxiety/ depression.  Alcohol and substance abuse.  ____________________________________________   PHYSICAL EXAM:  VITAL SIGNS: ED Triage Vitals  Enc Vitals Group     BP 04/21/19 1227 120/70     Pulse Rate 04/21/19 1227 84     Resp 04/21/19 1227 15     Temp 04/21/19 1227 97.6 F (36.4 C)     Temp Source 04/21/19 1227 Oral     SpO2 04/21/19 1227 97 %     Weight 04/21/19 1221 240 lb (108.9 kg)     Height 04/21/19 1221 5\' 5"  (1.651 m)     Head Circumference --      [place] Flow --      Pain Score 04/21/19 1220 8     Pain Loc --      Pain Edu? --      Excl. in [place]? --     Constitutional: Alert and oriented. Well appearing and in no acute distress.  Morbid obesity. Cardiovascular: Normal rate, regular rhythm. Grossly normal heart sounds.  Good peripheral circulation. Respiratory: Normal respiratory effort.  No retractions. Lungs CTAB. Gastrointestinal: Soft and nontender. No distention. No abdominal bruits.  Right CVA tenderness. Genitourinary: Deferred Musculoskeletal: Patient habitus limits exam.  No obvious lumbar deformity.  Patient decreased range of motion with flexion limited by complaint of pain.  No lower extremity tenderness nor edema.  No joint effusions. Neurologic:  Normal speech and language. No gross focal neurologic deficits are appreciated. No gait instability. Skin:  Skin is warm, dry and intact. No rash noted. Psychiatric: Mood and affect are normal. Speech and behavior are normal.  ____________________________________________   LABS (all labs ordered are listed, but only abnormal results are displayed)  Labs Reviewed  URINALYSIS, COMPLETE (UACMP) WITH MICROSCOPIC - Abnormal; Notable for the following components:      Result Value   Color, Urine STRAW (*)    APPearance CLEAR (*)    All other components within normal limits  POCT PREGNANCY, URINE  POC URINE PREG, ED    ____________________________________________  EKG   ____________________________________________  RADIOLOGY  ED MD interpretation:    Official radiology report(s): No results found.  ____________________________________________   PROCEDURES  Procedure(s) performed (including Critical Care):  Procedures   ____________________________________________   INITIAL IMPRESSION / ASSESSMENT AND PLAN / ED COURSE  As part of my medical decision making, I reviewed the following data within the [place]   Patient presents with right flank pain for probably 2 weeks.  Patient unsure as of provocative incident for complaint.  Patient also complained urinary frequency.  Discussed negative lab results with patient.  Patient physical exam is consistent  muscle skeletal pain of the right flank.  Patient given discharge care instruction advised take medication as directed.  Patient advised follow-up PCP if condition persist.      SHRADDHA HORRY was evaluated in Emergency Department on 04/21/2019 for the symptoms described in the history of present illness. She was evaluated in the context of the global COVID-19 pandemic, which necessitated consideration that the patient might be at risk for infection with the SARS-CoV-2 virus that causes COVID-19. Institutional protocols and algorithms that pertain to the evaluation of patients at risk for COVID-19 are in a state of rapid change based on information released by regulatory bodies including the CDC and federal and state organizations. These policies and algorithms were followed during the patient's care in the ED.       ____________________________________________   FINAL CLINICAL IMPRESSION(S) / ED DIAGNOSES  Final diagnoses:  Right flank pain     ED Discharge Orders         Ordered    cyclobenzaprine (FLEXERIL) 10 MG tablet  3 times daily PRN     04/21/19 1335    ibuprofen (ADVIL) 600 MG tablet  Every 8 hours  PRN     04/21/19 1335           Note:  This document was prepared using Dragon voice recognition software and may include unintentional dictation errors.    Sable Feil, PA-C 04/21/19 1343    Harvest Dark, MD 04/21/19 1400

## 2019-04-21 NOTE — ED Triage Notes (Signed)
Pt here for right lower back pain X 2 weeks. Pain radiates down right leg.  NAD. Ambulatory. Worse when bending forward.

## 2019-04-22 ENCOUNTER — Other Ambulatory Visit: Payer: Medicaid Other

## 2019-04-22 NOTE — Telephone Encounter (Signed)
Returned patients call to reschedule her colonoscopy from 04/23/19 to another date.  LVM for her to call me back.  Thanks [employer]

## 2019-04-23 ENCOUNTER — Ambulatory Visit: Admission: RE | Admit: 2019-04-23 | Payer: Self-pay | Source: Home / Self Care | Admitting: Gastroenterology

## 2019-04-23 ENCOUNTER — Encounter: Admission: RE | Payer: Self-pay | Source: Home / Self Care

## 2019-04-23 SURGERY — COLONOSCOPY WITH PROPOFOL
Anesthesia: General

## 2019-04-27 ENCOUNTER — Other Ambulatory Visit: Payer: Self-pay

## 2019-04-27 ENCOUNTER — Ambulatory Visit: Payer: Medicaid Other | Admitting: Pharmacy Technician

## 2019-04-27 DIAGNOSIS — Z79899 Other long term (current) drug therapy: Secondary | ICD-10-CM

## 2019-04-27 NOTE — Progress Notes (Signed)
Completed [facility] application and contract.  Patient agreed to all terms of the [facility] contract.    Patient approved to receive medication assistance at [facility] as long as eligibility criteria continues to be met.    Provided patient with community resource material based on her particular needs.    [place] [facility]

## 2019-04-28 ENCOUNTER — Ambulatory Visit: Payer: Medicaid Other | Admitting: Gerontology

## 2019-04-30 ENCOUNTER — Ambulatory Visit: Payer: Medicaid Other

## 2019-04-30 ENCOUNTER — Other Ambulatory Visit: Payer: Self-pay

## 2019-04-30 DIAGNOSIS — Z79899 Other long term (current) drug therapy: Secondary | ICD-10-CM

## 2019-04-30 NOTE — Progress Notes (Signed)
[facility] Clinic Visit Note  Patient: Tamara Short MRN: [id number] Date of Birth: 1960-05-01 PCP: Langston Reusing, NP   Polly Cobia 59 y.o. female presents for a telephone medication therapy management visit with the pharmacist today. Patient identified using two patient identifiers.   There were no vitals taken for this visit.  Patient Information   Past Medical History:  Diagnosis Date  . Anxiety   . Depression   . Fibroid   . Medical history non-contributory       Past Surgical History:  Procedure Laterality Date  . cyst removed Left 1983  . HYSTEROSCOPY WITH NOVASURE N/A 02/02/2014   Procedure: Dilatation and Curretage Hysteroscopy with resection of fibroid.HYSTEROSCOPY WITH attempted novasure. HYSTEROSCOPY WITH attempted  HYDROTHERMAL ABLATION;  Surgeon: Mora Bellman, MD;  Location: [facility];  Service: Gynecology;  Laterality: N/A;  . NO PAST SURGERIES       Family History  Problem Relation Age of Onset  . Alcohol abuse Neg Hx   . Arthritis Neg Hx   . Asthma Neg Hx   . Birth defects Neg Hx   . Cancer Neg Hx   . COPD Neg Hx   . Depression Neg Hx   . Diabetes Neg Hx   . Drug abuse Neg Hx   . Early death Neg Hx   . Hearing loss Neg Hx   . Heart disease Neg Hx   . Hyperlipidemia Neg Hx   . Hypertension Neg Hx   . Kidney disease Neg Hx   . Learning disabilities Neg Hx   . Mental illness Neg Hx   . Mental retardation Neg Hx   . Miscarriages / Stillbirths Neg Hx   . Stroke Neg Hx   . Vision loss Neg Hx   . Varicose Veins Neg Hx     New Diagnoses (since last visit): None  Family Support: Good   Social History   Substance and Sexual Activity  Alcohol Use No   Denies alcohol consumption.   Social History   Tobacco Use  Smoking Status Former Smoker  . Types: Cigarettes  . Quit date: 12/22/2018  . Years since quitting: 0.3  Smokeless Tobacco Never Used   Denies tobacco use. Denies illicit substances at this time.    Health  Maintenance  Topic Date Due  . Hepatitis C Screening  1960-10-03  . HIV Screening  05/02/1977  . TETANUS/TDAP  07/16/2011  . COLONOSCOPY  05/02/2012  . MAMMOGRAM  12/13/2014  . PAP SMEAR-Modifier  11/01/2015  . INFLUENZA VACCINE  07/15/2019 (Originally 11/15/2018)   Outpatient Encounter Medications as of 04/30/2019  Medication Sig  . FLUoxetine (PROZAC) 20 MG capsule Take 1 capsule (20 mg total) by mouth daily.  . hydrOXYzine (ATARAX/VISTARIL) 25 MG tablet Take 1 tablet (25 mg total) by mouth every 6 (six) hours as needed for anxiety (or CIWA score </= 10).  . ibuprofen (ADVIL) 600 MG tablet Take 1 tablet (600 mg total) by mouth every 8 (eight) hours as needed.  . Multiple Vitamin (MULTIVITAMIN) tablet Take 1 tablet by mouth daily.  . traZODone (DESYREL) 50 MG tablet Take 1 tablet (50 mg total) by mouth at bedtime and may repeat dose one time if needed.  . cyclobenzaprine (FLEXERIL) 10 MG tablet Take 1 tablet (10 mg total) by mouth 3 (three) times daily as needed. (Patient not taking: Reported on 04/30/2019)   No facility-administered encounter medications on file as of 04/30/2019.    Health Maintenance/Date Completed  Last ED visit:  04/21/2019 (back pain) Last Visit to PCP: 03/26/2019 [facility]) Next Visit to PCP: 06/24/2019 [facility]) Specialist Visit: Mental Health (next appointment scheduled for 05/2019) Dental Exam: Reports last was about 5 years ago Eye Exam: Does not wear glasses. Reports last was about 3 years ago. Pelvic/PAP Exam: None recently Mammogram: None recently DEXA: <65 y/o Colonoscopy: Scheduled 04/23/2019 but cancelled. Pending re-scheduling. Flu Vaccine: Elects not to receive vaccine Pneumonia Vaccine: Never  Assessment and Plan:  1. Depression -Medical history of severe major depression and substance induced mood disorder -Fluoxetine 20 mg daily, trazodone 50 mg HS (insomnia) and hydroxyzine 25 mg PRN (anxiety)  2. Back pain -Presented to [facility] ED 04/21/2019 with back  pain -Discharged with cyclobenzaprine 10 mg TID PRN #15 (completed prescription) and ibuprofen 600 mg q8h PRN #15 (one tablet left)  3. Polysubstance abuse -History of cannabis, cocaine, tobacco, and alcohol use -Endorses she is abstaining from alcohol, tobacco, and illicit substances  4. Health Maintenance -Due for colonoscopy, mammogram, PAP smear -Provided patient with [place] [facility]CP and 211 information  5. Adherence -Reports adherence to prescribed medications -Not requiring refills on medications at this time  RTC 1 year  [place] Resident 30 April 2019

## 2019-05-21 ENCOUNTER — Other Ambulatory Visit: Payer: Self-pay | Admitting: Gerontology

## 2019-05-21 DIAGNOSIS — F322 Major depressive disorder, single episode, severe without psychotic features: Secondary | ICD-10-CM

## 2019-06-24 ENCOUNTER — Other Ambulatory Visit: Payer: Self-pay

## 2019-06-24 ENCOUNTER — Encounter: Payer: Self-pay | Admitting: Gerontology

## 2019-06-24 ENCOUNTER — Ambulatory Visit: Payer: Medicaid Other | Admitting: Gerontology

## 2019-06-24 VITALS — BP 132/86 | HR 63 | Ht 65.0 in | Wt 256.2 lb

## 2019-06-24 DIAGNOSIS — Z0000 Encounter for general adult medical examination without abnormal findings: Secondary | ICD-10-CM

## 2019-06-24 DIAGNOSIS — Z09 Encounter for follow-up examination after completed treatment for conditions other than malignant neoplasm: Secondary | ICD-10-CM | POA: Insufficient documentation

## 2019-06-24 NOTE — Patient Instructions (Signed)

## 2019-06-24 NOTE — Progress Notes (Signed)
Established Patient Office Visit  Subjective:  Patient ID: Tamara Short, female    DOB: [birth year]  Age: 59 y.o. MRN: [id number]  CC:  Chief Complaint  Patient presents with  . Follow-up    HPI Tamara Short presents for follow up. She was seen at the ED on 04/21/2019 for right flank pain that radiates to right leg and was treated with Flexrill and Ibuprofen. Currently, she denies any flank or back pain. She denies chest pain, light headedness, fever and chills. She states that she rescheduled her Colonoscopy screening and will call to schedule Mammogram and Pap smear. She states that her mood is good, denies suicidal nor homicidal ideation and continues [facility]. Overall, she states that she's doing well and offers no further complaint.  Past Medical History:  Diagnosis Date  . Anxiety   . Depression   . Fibroid   . Medical history non-contributory     Past Surgical History:  Procedure Laterality Date  . cyst removed Left 1983  . HYSTEROSCOPY WITH NOVASURE N/A 02/02/2014   Procedure: Dilatation and Curretage Hysteroscopy with resection of fibroid.HYSTEROSCOPY WITH attempted novasure. HYSTEROSCOPY WITH attempted  HYDROTHERMAL ABLATION;  Surgeon: Mora Bellman, MD;  Location: [facility];  Service: Gynecology;  Laterality: N/A;  . NO PAST SURGERIES      Family History  Problem Relation Age of Onset  . Alcohol abuse Neg Hx   . Arthritis Neg Hx   . Asthma Neg Hx   . Birth defects Neg Hx   . Cancer Neg Hx   . COPD Neg Hx   . Depression Neg Hx   . Diabetes Neg Hx   . Drug abuse Neg Hx   . Early death Neg Hx   . Hearing loss Neg Hx   . Heart disease Neg Hx   . Hyperlipidemia Neg Hx   . Hypertension Neg Hx   . Kidney disease Neg Hx   . Learning disabilities Neg Hx   . Mental illness Neg Hx   . Mental retardation Neg Hx   . Miscarriages / Stillbirths Neg Hx   . Stroke Neg Hx   . Vision loss Neg Hx   . Varicose Veins Neg Hx      Social History   Socioeconomic History  . Marital status: Single    Spouse name: Not on file  . Number of children: Not on file  . Years of education: Not on file  . Highest education level: Not on file  Occupational History  . Not on file  Tobacco Use  . Smoking status: Former Smoker    Types: Cigarettes    Quit date: 12/22/2018    Years since quitting: 0.5  . Smokeless tobacco: Never Used  Substance and Sexual Activity  . Alcohol use: No  . Drug use: No  . Sexual activity: Not Currently  Other Topics Concern  . Not on file  Social History Narrative  . Not on file   Social Determinants of Health   Financial Resource Strain:   . Difficulty of Paying Living Expenses: Not on file  Food Insecurity:   . Worried About Charity fundraiser in the Last Year: Not on file  . Ran Out of Food in the Last Year: Not on file  Transportation Needs:   . Lack of Transportation (Medical): Not on file  . Lack of Transportation (Non-Medical): Not on file  Physical Activity:   . Days of  Exercise per Week: Not on file  . Minutes of Exercise per Session: Not on file  Stress:   . Feeling of Stress : Not on file  Social Connections:   . Frequency of Communication with Friends and Family: Not on file  . Frequency of Social Gatherings with Friends and Family: Not on file  . Attends Religious Services: Not on file  . Active Member of Clubs or Organizations: Not on file  . Attends Archivist Meetings: Not on file  . Marital Status: Not on file  Intimate Partner Violence:   . Fear of Current or Ex-Partner: Not on file  . Emotionally Abused: Not on file  . Physically Abused: Not on file  . Sexually Abused: Not on file    Outpatient Medications Prior to Visit  Medication Sig Dispense Refill  . FLUoxetine (PROZAC) 20 MG capsule Take 1 capsule (20 mg total) by mouth daily. 30 capsule 0  . hydrOXYzine (ATARAX/VISTARIL) 25 MG tablet Take 1 tablet (25 mg total) by mouth every 6 (six)  hours as needed for anxiety (or CIWA score </= 10). 30 tablet 0  . Multiple Vitamin (MULTIVITAMIN) tablet Take 1 tablet by mouth daily.    . traZODone (DESYREL) 50 MG tablet Take 1 tablet (50 mg total) by mouth at bedtime and may repeat dose one time if needed. 30 tablet 0  . cyclobenzaprine (FLEXERIL) 10 MG tablet Take 1 tablet (10 mg total) by mouth 3 (three) times daily as needed. (Patient not taking: Reported on 04/30/2019) 15 tablet 0  . ibuprofen (ADVIL) 600 MG tablet Take 1 tablet (600 mg total) by mouth every 8 (eight) hours as needed. (Patient not taking: Reported on 06/24/2019) 15 tablet 0   No facility-administered medications prior to visit.    No Known Allergies  ROS Review of Systems  Constitutional: Negative.   Respiratory: Negative.   Cardiovascular: Negative.   Genitourinary: Negative.   Neurological: Negative.   Psychiatric/Behavioral: Negative.       Objective:    Physical Exam  Constitutional: She is oriented to person, place, and time. She appears well-developed.  HENT:  Head: Normocephalic and atraumatic.  Cardiovascular: Normal rate and regular rhythm.  Pulmonary/Chest: Effort normal and breath sounds normal.  Musculoskeletal:        General: Normal range of motion.  Neurological: She is alert and oriented to person, place, and time.  Psychiatric: She has a normal mood and affect. Her behavior is normal. Judgment and thought content normal.    BP 132/86 (BP Location: Left Arm, Patient Position: Sitting, Cuff Size: Large)   Pulse 63   Ht 5\' 5"  (1.651 m)   Wt 256 lb 3.2 oz (116.2 kg)   SpO2 95%   BMI 42.63 kg/m  Wt Readings from Last 3 Encounters:  06/24/19 256 lb 3.2 oz (116.2 kg)  04/21/19 240 lb (108.9 kg)  03/26/19 251 lb (113.9 kg)   She gained 16 pounds in 8 weeks, she was advised to lose weight.  Health Maintenance Due  Topic Date Due  . Hepatitis C Screening  05[birth year]  . HIV Screening  05/02/1977  . TETANUS/TDAP  07/16/2011  .  COLONOSCOPY  05/02/2012  . MAMMOGRAM  12/13/2014  . PAP SMEAR-Modifier  11/01/2015    There are no preventive care reminders to display for this patient.  Lab Results  Component Value Date   TSH 0.613 04/02/2019   Lab Results  Component Value Date   WBC 6.8 04/02/2019   HGB 13.9  04/02/2019   HCT 42.3 04/02/2019   MCV 91 04/02/2019   PLT 223 04/02/2019   Lab Results  Component Value Date   NA 142 04/02/2019   K 4.2 04/02/2019   CO2 26 04/02/2019   GLUCOSE 93 04/02/2019   BUN 10 04/02/2019   CREATININE 0.93 04/02/2019   BILITOT 0.3 04/02/2019   ALKPHOS 72 04/02/2019   AST 27 04/02/2019   ALT 21 04/02/2019   PROT 6.9 04/02/2019   ALBUMIN 4.2 04/02/2019   CALCIUM 9.8 04/02/2019   ANIONGAP 8 11/10/2016   Lab Results  Component Value Date   CHOL 191 04/02/2019   Lab Results  Component Value Date   HDL 73 04/02/2019   Lab Results  Component Value Date   LDLCALC 101 (H) 04/02/2019   Lab Results  Component Value Date   TRIG 94 04/02/2019   Lab Results  Component Value Date   CHOLHDL 2.6 04/02/2019   Lab Results  Component Value Date   HGBA1C 5.3 04/02/2019      Assessment & Plan:     1. Health care maintenance - She was encouraged to call and schedule an appointment for Mammogram and Pap smear. - Ambulatory referral to Hematology / Oncology  2. Follow up - She was advised to make healthy life style changes, exercise as tolerated and decrease calorie intake.   Follow-up: Return in about 6 months (around 12/24/2019), or if symptoms worsen or fail to improve.    Aubrei Bouchie Jerold Coombe, NP

## 2019-08-05 ENCOUNTER — Ambulatory Visit: Payer: Medicaid Other

## 2019-08-25 ENCOUNTER — Ambulatory Visit: Payer: Self-pay | Attending: Oncology | Admitting: *Deleted

## 2019-08-25 ENCOUNTER — Other Ambulatory Visit: Payer: Self-pay

## 2019-08-25 ENCOUNTER — Ambulatory Visit
Admission: RE | Admit: 2019-08-25 | Discharge: 2019-08-25 | Disposition: A | Discharge status: Home / Self Care | Payer: Medicaid Other | Source: Ambulatory Visit | Attending: Oncology | Admitting: Oncology

## 2019-08-25 VITALS — BP 132/89 | HR 64 | Temp 98.8°F | Ht 65.0 in | Wt 254.0 lb

## 2019-08-25 DIAGNOSIS — Z0000 Encounter for general adult medical examination without abnormal findings: Secondary | ICD-10-CM

## 2019-08-25 NOTE — Progress Notes (Signed)
  Subjective:     Patient ID: Tamara Short, female   DOB: 23-May-1960, 60 y.o.   MRN: [id number]  HPI   Review of Systems     Objective:   Physical Exam Chest:     Breasts:        Right: No swelling, bleeding, inverted nipple, mass, nipple discharge, skin change or tenderness.        Left: No swelling, bleeding, inverted nipple, mass, nipple discharge, skin change or tenderness.    Abdominal:     Palpations: There is no hepatomegaly or splenomegaly.     Hernia: There is no hernia in the left inguinal area or right inguinal area.  Genitourinary:    Exam position: Lithotomy position.     Labia:        Right: No rash, tenderness, lesion or injury.        Left: No rash, tenderness, lesion or injury.      Urethra: No prolapse or urethral pain.     Vagina: No signs of injury and foreign body. No vaginal discharge, erythema, tenderness, bleeding, lesions or prolapsed vaginal walls.     Cervix: No cervical motion tenderness, discharge, friability, lesion or erythema.     Uterus: Not deviated, not enlarged, not fixed and no uterine prolapse.      Adnexa:        Right: No mass, tenderness or fullness.         Left: No mass, tenderness or fullness.       Rectum: No mass.  Lymphadenopathy:     Upper Body:     Right upper body: No supraclavicular or axillary adenopathy.     Left upper body: No supraclavicular or axillary adenopathy.     Lower Body: No right inguinal adenopathy. No left inguinal adenopathy.        Assessment:     59 year old Black female presents to [facility] for clinical breast exam, mammogram and pap smear.  Clinical breast exam unremarkable.  Taught self breast awareness.  Specimen collected for pap without difficulty.  Patient has been screened for eligibility.  She does not have any insurance, Medicare or Medicaid.  She also meets financial eligibility.   Risk Assessment    Risk Scores      08/25/2019   Last edited by: Theodore Demark, RN   5-year risk: 1.4 %   Lifetime risk: 7.5 %            Plan:     Screening mammogram ordered.  Specimen for pap sent to the lab.  Will follow up per BCCCP protocol.

## 2019-08-25 NOTE — Patient Instructions (Signed)
Gave patient hand-out, Women Staying Healthy, Active and Well from BCCCP, with education on breast health, pap smears, heart and colon health. 

## 2019-08-29 LAB — IGP, APTIMA HPV: HPV Aptima: NEGATIVE

## 2019-08-31 ENCOUNTER — Encounter: Payer: Self-pay | Admitting: *Deleted

## 2019-08-31 NOTE — Progress Notes (Signed)
Called and spoke to patient today and reviewed her normal mammogram and pap smear results.  Discussed that she does have Candida on the pap results.  Denies any symptoms.  If asymptomatic no treatment required.  She is to follow up in one year with annual mammogram.  Next pap due in 5 years.

## 2019-10-20 ENCOUNTER — Other Ambulatory Visit: Payer: Self-pay

## 2019-10-20 ENCOUNTER — Ambulatory Visit: Payer: Medicaid Other | Admitting: Pharmacy Technician

## 2019-10-20 DIAGNOSIS — Z79899 Other long term (current) drug therapy: Secondary | ICD-10-CM

## 2019-10-20 NOTE — Progress Notes (Signed)
Completed [facility] application and contract for re-certification. Patient agreed to all terms of the [facility] contract.   Patient approved to receive medication assistance at [facility] until time for re-certification in 1327, and as long as eligibility criteria continues to be met.   Provided patient with community resource material based on her particular needs.    [place] [facility]

## 2019-12-24 ENCOUNTER — Ambulatory Visit: Payer: Medicaid Other | Admitting: Gerontology

## 2020-02-15 ENCOUNTER — Other Ambulatory Visit: Payer: Self-pay

## 2020-02-15 ENCOUNTER — Emergency Department: Payer: HRSA Program

## 2020-02-15 ENCOUNTER — Emergency Department
Admission: EM | Admit: 2020-02-15 | Discharge: 2020-02-15 | Disposition: A | Discharge status: Home / Self Care | Payer: HRSA Program | Attending: Emergency Medicine | Admitting: Emergency Medicine

## 2020-02-15 DIAGNOSIS — Z87891 Personal history of nicotine dependence: Secondary | ICD-10-CM | POA: Insufficient documentation

## 2020-02-15 DIAGNOSIS — Z79899 Other long term (current) drug therapy: Secondary | ICD-10-CM | POA: Diagnosis not present

## 2020-02-15 DIAGNOSIS — U071 COVID-19: Secondary | ICD-10-CM | POA: Diagnosis not present

## 2020-02-15 DIAGNOSIS — E860 Dehydration: Secondary | ICD-10-CM

## 2020-02-15 DIAGNOSIS — R1084 Generalized abdominal pain: Secondary | ICD-10-CM | POA: Diagnosis present

## 2020-02-15 LAB — URINALYSIS, COMPLETE (UACMP) WITH MICROSCOPIC
Bacteria, UA: NONE SEEN
Bilirubin Urine: NEGATIVE
Glucose, UA: NEGATIVE mg/dL
Hgb urine dipstick: NEGATIVE
Ketones, ur: 5 mg/dL — AB
Leukocytes,Ua: NEGATIVE
Nitrite: NEGATIVE
Protein, ur: NEGATIVE mg/dL
Specific Gravity, Urine: 1.01 (ref 1.005–1.030)
pH: 6 (ref 5.0–8.0)

## 2020-02-15 LAB — CBC WITH DIFFERENTIAL/PLATELET
Abs Immature Granulocytes: 0.02 10*3/uL (ref 0.00–0.07)
Basophils Absolute: 0 10*3/uL (ref 0.0–0.1)
Basophils Relative: 0 %
Eosinophils Absolute: 0 10*3/uL (ref 0.0–0.5)
Eosinophils Relative: 0 %
HCT: 44.3 % (ref 36.0–46.0)
Hemoglobin: 14.6 g/dL (ref 12.0–15.0)
Immature Granulocytes: 0 %
Lymphocytes Relative: 35 %
Lymphs Abs: 1.6 10*3/uL (ref 0.7–4.0)
MCH: 29.3 pg (ref 26.0–34.0)
MCHC: 33 g/dL (ref 30.0–36.0)
MCV: 89 fL (ref 80.0–100.0)
Monocytes Absolute: 0.3 10*3/uL (ref 0.1–1.0)
Monocytes Relative: 7 %
Neutro Abs: 2.5 10*3/uL (ref 1.7–7.7)
Neutrophils Relative %: 58 %
Platelets: 131 10*3/uL — ABNORMAL LOW (ref 150–400)
RBC: 4.98 MIL/uL (ref 3.87–5.11)
RDW: 13.4 % (ref 11.5–15.5)
WBC: 4.5 10*3/uL (ref 4.0–10.5)
nRBC: 0.4 % — ABNORMAL HIGH (ref 0.0–0.2)

## 2020-02-15 LAB — APTT: aPTT: 28 seconds (ref 24–36)

## 2020-02-15 LAB — COMPREHENSIVE METABOLIC PANEL
ALT: 28 U/L (ref 0–44)
AST: 36 U/L (ref 15–41)
Albumin: 3.5 g/dL (ref 3.5–5.0)
Alkaline Phosphatase: 44 U/L (ref 38–126)
Anion gap: 8 (ref 5–15)
BUN: 11 mg/dL (ref 6–20)
CO2: 25 mmol/L (ref 22–32)
Calcium: 8.5 mg/dL — ABNORMAL LOW (ref 8.9–10.3)
Chloride: 105 mmol/L (ref 98–111)
Creatinine, Ser: 1.16 mg/dL — ABNORMAL HIGH (ref 0.44–1.00)
GFR, Estimated: 55 mL/min — ABNORMAL LOW (ref >60)
Glucose, Bld: 107 mg/dL — ABNORMAL HIGH (ref 70–99)
Potassium: 3.4 mmol/L — ABNORMAL LOW (ref 3.5–5.1)
Sodium: 138 mmol/L (ref 135–145)
Total Bilirubin: 0.6 mg/dL (ref 0.3–1.2)
Total Protein: 7.4 g/dL (ref 6.5–8.1)

## 2020-02-15 LAB — RESPIRATORY PANEL BY RT PCR (FLU A&B, COVID)
Influenza A by PCR: NEGATIVE
Influenza B by PCR: NEGATIVE
SARS Coronavirus 2 by RT PCR: POSITIVE — AB

## 2020-02-15 LAB — PREGNANCY, URINE: Preg Test, Ur: NEGATIVE

## 2020-02-15 LAB — LACTIC ACID, PLASMA
Lactic Acid, Venous: 1.8 mmol/L (ref 0.5–1.9)
Lactic Acid, Venous: 0.8 mmol/L (ref 0.5–1.9)

## 2020-02-15 LAB — PROTIME-INR
INR: 1 (ref 0.8–1.2)
Prothrombin Time: 12.6 seconds (ref 11.4–15.2)

## 2020-02-15 LAB — LIPASE, BLOOD: Lipase: 51 U/L (ref 11–51)

## 2020-02-15 MED ORDER — ONDANSETRON 4 MG PO TBDP: 4.0000 mg | ORAL_TABLET | Freq: Three times a day (TID) | ORAL | 0 refills | Status: AC | PRN

## 2020-02-15 MED ORDER — FAMOTIDINE 20 MG PO TABS: 20.0000 mg | ORAL_TABLET | Freq: Two times a day (BID) | ORAL | 0 refills | Status: AC

## 2020-02-15 MED ORDER — ONDANSETRON HCL 4 MG/2ML IJ SOLN
4.0000 mg | Freq: Once | INTRAMUSCULAR | Status: AC
  Administered 2020-02-15: 4 mg via INTRAVENOUS
  Filled 2020-02-15: qty 2

## 2020-02-15 MED ORDER — SODIUM CHLORIDE 0.9 % IV BOLUS (SEPSIS)
800.0000 mL | Freq: Once | INTRAVENOUS | Status: AC
  Administered 2020-02-15: 800 mL via INTRAVENOUS

## 2020-02-15 MED ORDER — SODIUM CHLORIDE 0.9 % IV SOLN
2.0000 g | Freq: Once | INTRAVENOUS | Status: AC
  Administered 2020-02-15: 2 g via INTRAVENOUS
  Filled 2020-02-15: qty 20

## 2020-02-15 MED ORDER — SODIUM CHLORIDE 0.9 % IV BOLUS (SEPSIS)
1000.0000 mL | Freq: Once | INTRAVENOUS | Status: AC
  Administered 2020-02-15: 1000 mL via INTRAVENOUS

## 2020-02-15 MED ORDER — METRONIDAZOLE IN NACL 5-0.79 MG/ML-% IV SOLN
500.0000 mg | Freq: Once | INTRAVENOUS | Status: AC
  Administered 2020-02-15: 500 mg via INTRAVENOUS
  Filled 2020-02-15: qty 100

## 2020-02-15 NOTE — ED Triage Notes (Signed)
Pt here with abd pain and a HA that started a few days ago and is in the center of her abd. Pt states that when she coughs her head hurts, she's thirsty, and weak. Pt moaning in triage.

## 2020-02-15 NOTE — ED Notes (Signed)
Pt states her abd pain was lower mid abd pain, felt like cramping. Pt describes headache as "it just hurt real bad"

## 2020-02-15 NOTE — ED Notes (Signed)
Pt presents with headache, fever, abdominal pain since Saturday.   Pt diaphoretic on arrival, weakness, hyptotensive, mild disorientation, febrile.   Pt reports she has been lightheaded, dizzy, headache, and abdominal pain all starting Saturday, with fever starting approx 2 days before. Pt denies n/v, states one episode of diarrhea. Pt denies black tarry stools or blood in stools.   Pt does say when she was at work on Thursday, she began to feel weak, thirsty, and sweaty. Denies hx of BP problems, GI problems, diabetes, cardiac issues, pulmonary problems. States she "had some thyroid problems, but that was a long time ago."  At this time, pt is no longer diaphoretic, denies current headache/abd pain, a&o x4. Pt states "I feel much better now."

## 2020-02-15 NOTE — Progress Notes (Signed)
CODE SEPSIS - PHARMACY COMMUNICATION  **Broad Spectrum Antibiotics should be administered within 1 hour of Sepsis diagnosis**  Time Code Sepsis Called/Page Received: 1615   Antibiotics Ordered: Ceftriaxone 2gm and Flagyl 500mg   Time of 1st antibiotic administration: [place], PharmD, [facility] 02/15/2020 4:30 PM

## 2020-02-15 NOTE — ED Provider Notes (Signed)
[facility] Emergency Department Provider Note  ____________________________________________  Time seen: Approximately 7:21 PM  I have reviewed the triage vital signs and the nursing notes.   HISTORY  Chief Complaint Abdominal Pain and Headache    HPI Tamara Short is a 59 y.o. female with a history of anxiety depression and substance abuse who comes to the ED complaining of generalized abdominal pain and bilateral headache, reports that she is also had fever body aches malaise decreased oral intake and decreased appetite for the past week.  Symptoms are constant, waxing and waning, no aggravating or alleviating factors, no dysuria shortness of breath or cough.  No sick contacts.        Past Medical History:  Diagnosis Date  . Anxiety   . Depression   . Fibroid   . Medical history non-contributory      Patient Active Problem List   Diagnosis Date Noted  . Health care maintenance 06/24/2019  . Follow up 06/24/2019  . Encounter to establish care 03/26/2019  . Substance induced mood disorder ([place])   . Alcohol abuse   . Cocaine abuse with cocaine-induced mood disorder ([place])   . Nicotine abuse   . Severe major depression ([place]) 11/10/2016  . Abnormal uterine bleeding 09/30/2013  . Menorrhagia 10/31/2012  . Anemia 10/31/2012  . OBESITY, NOS 06/13/2006     Past Surgical History:  Procedure Laterality Date  . cyst removed Left 1983  . HYSTEROSCOPY WITH NOVASURE N/A 02/02/2014   Procedure: Dilatation and Curretage Hysteroscopy with resection of fibroid.HYSTEROSCOPY WITH attempted novasure. HYSTEROSCOPY WITH attempted  HYDROTHERMAL ABLATION;  Surgeon: Mora Bellman, MD;  Location: [facility];  Service: Gynecology;  Laterality: N/A;  . NO PAST SURGERIES       Prior to Admission medications   Medication Sig Start Date End Date Taking? Authorizing Provider  famotidine (PEPCID) 20 MG tablet Take 1 tablet (20 mg total) by mouth 2 (two) times daily.  02/15/20   Carrie Mew, MD  FLUoxetine (PROZAC) 20 MG capsule Take 1 capsule (20 mg total) by mouth daily. 04/15/19   Iloabachie, Chioma E, NP  hydrOXYzine (ATARAX/VISTARIL) 25 MG tablet Take 1 tablet (25 mg total) by mouth every 6 (six) hours as needed for anxiety (or CIWA score </= 10). 04/15/19   Iloabachie, Chioma E, NP  Multiple Vitamin (MULTIVITAMIN) tablet Take 1 tablet by mouth daily.    [provider]  ondansetron (ZOFRAN ODT) 4 MG disintegrating tablet Take 1 tablet (4 mg total) by mouth every 8 (eight) hours as needed for nausea or vomiting. 02/15/20   Carrie Mew, MD  traZODone (DESYREL) 50 MG tablet Take 1 tablet (50 mg total) by mouth at bedtime and may repeat dose one time if needed. 04/15/19   Iloabachie, Chioma E, NP     Allergies Patient has no known allergies.   Family History  Problem Relation Age of Onset  . Alcohol abuse Neg Hx   . Arthritis Neg Hx   . Asthma Neg Hx   . Birth defects Neg Hx   . Cancer Neg Hx   . COPD Neg Hx   . Depression Neg Hx   . Diabetes Neg Hx   . Drug abuse Neg Hx   . Early death Neg Hx   . Hearing loss Neg Hx   . Heart disease Neg Hx   . Hyperlipidemia Neg Hx   . Hypertension Neg Hx   . Kidney disease Neg Hx   . Learning disabilities Neg Hx   .  Mental illness Neg Hx   . Mental retardation Neg Hx   . Miscarriages / Stillbirths Neg Hx   . Stroke Neg Hx   . Vision loss Neg Hx   . Varicose Veins Neg Hx     Social History Social History   Tobacco Use  . Smoking status: Former Smoker    Types: Cigarettes    Quit date: 12/22/2018    Years since quitting: 1.1  . Smokeless tobacco: Never Used  Vaping Use  . Vaping Use: Never used  Substance Use Topics  . Alcohol use: No  . Drug use: No    Review of Systems  Constitutional:   No fever positive chills.  Positive body aches ENT:   No sore throat. No rhinorrhea. Cardiovascular:   No chest pain or syncope. Respiratory:   No dyspnea or  cough. Gastrointestinal: Positive for abdominal pain and decreased appetite.  No vomiting or diarrhea Musculoskeletal:   Negative for focal pain or swelling All other systems reviewed and are negative except as documented above in ROS and HPI.  ____________________________________________   PHYSICAL EXAM:  VITAL SIGNS: ED Triage Vitals  Enc Vitals Group     BP 02/15/20 1556 (!) 64/42     Pulse Rate 02/15/20 1553 94     Resp 02/15/20 1553 16     Temp 02/15/20 1553 (!) 102.1 F (38.9 C)     Temp Source 02/15/20 1553 Oral     SpO2 02/15/20 1553 97 %     Weight 02/15/20 1554 255 lb (115.7 kg)     Height 02/15/20 1554 5\' 5"  (1.651 m)     Head Circumference --      Peak Flow --      Pain Score 02/15/20 1554 6     Pain Loc --      Pain Edu? --      Excl. in [place]? --     Vital signs reviewed, nursing assessments reviewed.   Constitutional:   Alert and oriented.  Ill-appearing. Eyes:   Conjunctivae are normal. EOMI. PERRL. ENT      Head:   Normocephalic and atraumatic.      Nose: Normal.      Mouth/Throat: Dry mucous membranes      Neck:   No meningismus. Full ROM. Hematological/Lymphatic/Immunilogical:   No cervical lymphadenopathy. Cardiovascular:   RRR. Symmetric bilateral radial and DP pulses.  No murmurs. Cap refill less than 2 seconds. Respiratory:   Normal respiratory effort without tachypnea/retractions. Breath sounds are clear and equal bilaterally. No wheezes/rales/rhonchi. Gastrointestinal:   Soft and nontender. Non distended. There is no CVA tenderness.  No rebound, rigidity, or guarding.  Musculoskeletal:   Normal range of motion in all extremities. No joint effusions.  No lower extremity tenderness.  No edema. Neurologic:   Normal speech and language.  Motor grossly intact. No acute focal neurologic deficits are appreciated.  Skin:    Skin is warm, dry and intact. No rash noted.  No petechiae, purpura, or bullae.  ____________________________________________     LABS (pertinent positives/negatives) (all labs ordered are listed, but only abnormal results are displayed) Labs Reviewed  RESPIRATORY PANEL BY RT PCR (FLU A&B, COVID) - Abnormal; Notable for the following components:      Result Value   SARS Coronavirus 2 by RT PCR POSITIVE (*)    All other components within normal limits  COMPREHENSIVE METABOLIC PANEL - Abnormal; Notable for the following components:   Potassium 3.4 (*)    Glucose, Bld  107 (*)    Creatinine, Ser 1.16 (*)    Calcium 8.5 (*)    GFR, Estimated 55 (*)    All other components within normal limits  URINALYSIS, COMPLETE (UACMP) WITH MICROSCOPIC - Abnormal; Notable for the following components:   Color, Urine YELLOW (*)    APPearance HAZY (*)    Ketones, ur 5 (*)    All other components within normal limits  CBC WITH DIFFERENTIAL/PLATELET - Abnormal; Notable for the following components:   Platelets 131 (*)    nRBC 0.4 (*)    All other components within normal limits  CULTURE, BLOOD (ROUTINE X 2)  CULTURE, BLOOD (ROUTINE X 2)  URINE CULTURE  LIPASE, BLOOD  LACTIC ACID, PLASMA  LACTIC ACID, PLASMA  PROTIME-INR  APTT  PREGNANCY, URINE   ____________________________________________   EKG  Interpreted by me Normal sinus rhythm rate of 71.  Right axis, normal intervals.  Normal QRS ST segments and T waves.  ____________________________________________    RADIOLOGY  DG Chest Port 1 View  Result Date: 02/15/2020 CLINICAL DATA:  Questionable sepsis EXAM: PORTABLE CHEST 1 VIEW COMPARISON:  None. FINDINGS: The heart size and mediastinal contours are within normal limits. Both lungs are clear. No pleural effusion. The visualized skeletal structures are unremarkable. IMPRESSION: No acute process in the chest. Electronically Signed   By: Macy Mis M.D.   On: 02/15/2020 16:46    ____________________________________________   PROCEDURES .Critical Care Performed by: Carrie Mew, MD Authorized by:  Carrie Mew, MD   Critical care provider statement:    Critical care time (minutes):  35   Critical care time was exclusive of:  Separately billable procedures and treating other patients   Critical care was necessary to treat or prevent imminent or life-threatening deterioration of the following conditions:  Dehydration   Critical care was time spent personally by me on the following activities:  Development of treatment plan with patient or surrogate, discussions with consultants, evaluation of patient's response to treatment, examination of patient, obtaining history from patient or surrogate, ordering and performing treatments and interventions, ordering and review of laboratory studies, ordering and review of radiographic studies, pulse oximetry, re-evaluation of patient's condition and review of old charts    ____________________________________________  DIFFERENTIAL DIAGNOSIS   Pneumonia, UTI, dehydration, electrolyte abnormality, Covid/viral syndrome  CLINICAL IMPRESSION / ASSESSMENT AND PLAN / ED COURSE  Medications ordered in the ED: Medications  sodium chloride 0.9 % bolus 1,000 mL (0 mLs Intravenous Stopped 02/15/20 1737)    And  sodium chloride 0.9 % bolus 800 mL (0 mLs Intravenous Stopped 02/15/20 1737)  cefTRIAXone (ROCEPHIN) 2 g in sodium chloride 0.9 % 100 mL IVPB (0 g Intravenous Stopped 02/15/20 1737)  metroNIDAZOLE (FLAGYL) IVPB 500 mg (0 mg Intravenous Stopped 02/15/20 1737)  ondansetron (ZOFRAN) injection 4 mg (4 mg Intravenous Given 02/15/20 1835)    Pertinent labs & imaging results that were available during my care of the patient were reviewed by me and considered in my medical decision making (see chart for details).  Tamara Short was evaluated in Emergency Department on 02/15/2020 for the symptoms described in the history of present illness. She was evaluated in the context of the global COVID-19 pandemic, which necessitated consideration that the patient  might be at risk for infection with the SARS-CoV-2 virus that causes COVID-19. Institutional protocols and algorithms that pertain to the evaluation of patients at risk for COVID-19 are in a state of rapid change based on information released by regulatory  bodies including the CDC and federal and state organizations. These policies and algorithms were followed during the patient's care in the ED.     Clinical Course as of Feb 14 1922  Molli Knock Feb 15, 2020  1638 Patient presented to triage complaining of not feeling well for the past few weeks.  Noted to have fever and hypotension, brought to treatment room right away and seen immediately by me.  Symptoms are concerning for intra-abdominal process, dehydration, electrolyte abnormality, Covid, sepsis.  Will check labs, give 2 L IV fluid bolus, empiric antibiotics with ceftriaxone and Flagyl.  Chest x-ray image viewed by me, appears unremarkable.   [PS]  1815 Work-up reassuring, Covid test is positive.  Doubt any bacterial infectious source.  We will follow-up urinalysis. We will check orthostatics and p.o. trial.  Patient comfortable with discharge home and referral to monoclonal antibody infusion team.  Discussed with spouse at bedside as well who is vaccinated and has not had significant symptoms   [PS]  1904 Ua normal. Orthostatics negative. Suitable for DC home     [PS]  1916 Passed PO trial. Referral submitted to [place] team   [PS]    Clinical Course User Index [PS] Carrie Mew, MD     ____________________________________________   FINAL CLINICAL IMPRESSION(S) / ED DIAGNOSES    Final diagnoses:  COVID-19 virus infection  Dehydration     ED Discharge Orders         Ordered    ondansetron (ZOFRAN ODT) 4 MG disintegrating tablet  Every 8 hours PRN        02/15/20 1920    famotidine (PEPCID) 20 MG tablet  2 times daily        02/15/20 1920          Portions of this note were generated with dragon dictation software.  Dictation errors may occur despite best attempts at proofreading.   Carrie Mew, MD 02/15/20 [phone]

## 2020-02-15 NOTE — Progress Notes (Signed)
Following for code sepsis 

## 2020-02-16 ENCOUNTER — Telehealth: Payer: Self-pay | Admitting: Adult Health

## 2020-02-16 ENCOUNTER — Telehealth: Payer: Self-pay | Admitting: Nurse Practitioner

## 2020-02-16 NOTE — Telephone Encounter (Signed)
Second attempt call.  Called and LMOM regarding monoclonal antibody treatment for COVID 19 given to those who are at risk for complications and/or hospitalization of the virus.  Patient meets criteria based on: BMI  Call back number given: [phone]  My chart message already sent.    Wilber Bihari, NP

## 2020-02-16 NOTE — Telephone Encounter (Signed)
Called to Discuss with patient about Covid symptoms and the use of the monoclonal antibody infusion for those with mild to moderate Covid symptoms and at a high risk of hospitalization.     Pt appears to qualify for this infusion due to co-morbid conditions and/or a member of an at-risk group in accordance with the FDA Emergency Use Authorization.  BMI >25, smoker.    Unable to reach pt. Voicemail message left and My Chart message sent.   Alda Lea, NP [facility]  [phone]

## 2020-02-17 LAB — URINE CULTURE: Culture: NO GROWTH

## 2020-02-20 LAB — CULTURE, BLOOD (ROUTINE X 2)
Culture: NO GROWTH
Culture: NO GROWTH

## 2020-05-02 ENCOUNTER — Other Ambulatory Visit: Payer: Medicaid Other

## 2021-04-04 ENCOUNTER — Other Ambulatory Visit: Payer: Self-pay
